# Patient Record
Sex: Female | Born: 1975 | Race: White | Hispanic: No | Marital: Married | Smoking: Never smoker
Health system: Southern US, Community
[De-identification: ages and names within clinical notes are randomized; demographics above are authoritative.]

## PROBLEM LIST (undated history)

## (undated) DIAGNOSIS — Z8619 Personal history of other infectious and parasitic diseases: Secondary | ICD-10-CM

## (undated) DIAGNOSIS — R51 Headache: Secondary | ICD-10-CM

## (undated) DIAGNOSIS — F419 Anxiety disorder, unspecified: Secondary | ICD-10-CM

## (undated) DIAGNOSIS — R519 Headache, unspecified: Secondary | ICD-10-CM

## (undated) DIAGNOSIS — T7840XA Allergy, unspecified, initial encounter: Secondary | ICD-10-CM

## (undated) HISTORY — PX: KNEE SURGERY: SHX244

## (undated) HISTORY — DX: Anxiety disorder, unspecified: F41.9

## (undated) HISTORY — DX: Personal history of other infectious and parasitic diseases: Z86.19

## (undated) HISTORY — DX: Headache: R51

## (undated) HISTORY — DX: Headache, unspecified: R51.9

## (undated) HISTORY — DX: Allergy, unspecified, initial encounter: T78.40XA

---

## 2005-01-30 ENCOUNTER — Emergency Department: Payer: Self-pay | Admitting: Emergency Medicine

## 2005-08-20 ENCOUNTER — Ambulatory Visit: Payer: Self-pay | Admitting: General Practice

## 2008-04-04 ENCOUNTER — Ambulatory Visit: Payer: Self-pay | Admitting: General Practice

## 2009-08-14 LAB — HM PAP SMEAR: HM Pap smear: POSITIVE

## 2009-11-01 ENCOUNTER — Ambulatory Visit: Payer: Self-pay

## 2011-01-12 ENCOUNTER — Ambulatory Visit: Payer: Self-pay | Admitting: General Practice

## 2014-07-26 LAB — HM PAP SMEAR: HM Pap smear: NEGATIVE

## 2015-07-23 ENCOUNTER — Encounter: Payer: Self-pay | Admitting: *Deleted

## 2015-08-02 ENCOUNTER — Encounter: Payer: Self-pay | Admitting: Obstetrics and Gynecology

## 2016-03-17 ENCOUNTER — Encounter: Payer: Self-pay | Admitting: Emergency Medicine

## 2016-03-17 ENCOUNTER — Emergency Department
Admission: EM | Admit: 2016-03-17 | Discharge: 2016-03-17 | Disposition: A | Discharge status: Home / Self Care | Payer: Commercial Managed Care - HMO | Attending: Emergency Medicine | Admitting: Emergency Medicine

## 2016-03-17 ENCOUNTER — Emergency Department: Payer: Commercial Managed Care - HMO

## 2016-03-17 DIAGNOSIS — M7918 Myalgia, other site: Secondary | ICD-10-CM

## 2016-03-17 DIAGNOSIS — M5412 Radiculopathy, cervical region: Secondary | ICD-10-CM | POA: Diagnosis not present

## 2016-03-17 DIAGNOSIS — M542 Cervicalgia: Secondary | ICD-10-CM | POA: Diagnosis present

## 2016-03-17 DIAGNOSIS — M62838 Other muscle spasm: Secondary | ICD-10-CM | POA: Diagnosis not present

## 2016-03-17 MED ORDER — DEXAMETHASONE SODIUM PHOSPHATE 10 MG/ML IJ SOLN
10.0000 mg | Freq: Once | INTRAMUSCULAR | Status: AC
  Administered 2016-03-17: 10 mg via INTRAMUSCULAR
  Filled 2016-03-17: qty 1

## 2016-03-17 MED ORDER — TRAMADOL HCL 50 MG PO TABS
50.0000 mg | ORAL_TABLET | Freq: Once | ORAL | Status: AC
  Administered 2016-03-17: 50 mg via ORAL
  Filled 2016-03-17: qty 1

## 2016-03-17 MED ORDER — ORPHENADRINE CITRATE ER 100 MG PO TB12: 100.0000 mg | ORAL_TABLET | Freq: Two times a day (BID) | ORAL | 0 refills | Status: DC

## 2016-03-17 MED ORDER — TRAMADOL HCL 50 MG PO TABS: 50.0000 mg | ORAL_TABLET | Freq: Four times a day (QID) | ORAL | 0 refills | Status: DC | PRN

## 2016-03-17 MED ORDER — METHYLPREDNISOLONE 4 MG PO TBPK: ORAL_TABLET | ORAL | 0 refills | Status: DC

## 2016-03-17 MED ORDER — ORPHENADRINE CITRATE 30 MG/ML IJ SOLN
60.0000 mg | Freq: Two times a day (BID) | INTRAMUSCULAR | Status: DC
  Administered 2016-03-17: 60 mg via INTRAMUSCULAR
  Filled 2016-03-17: qty 2

## 2016-03-17 NOTE — ED Notes (Signed)
See triage note  Having neck pain  Describes pain as spasm like with some burning   Denies recent injury

## 2016-03-17 NOTE — ED Provider Notes (Signed)
[facility] Emergency Department Provider Note  ____________________________________________   None    (approximate)  I have reviewed the triage vital signs and the nursing notes.   HISTORY  Chief Complaint Neck Pain   HPI Dawn Conrad is a 40 y.o. female who presents with neck pain x6 days that has worsened in the last 4. Patient describes pain as severe, aching, and exacerbated with certain movements. Patient has tried tylenol, goody powder, and heat at home. She also describes a burning sensation in addition to pain. Denies any numbness or tingling. Patient is able to move neck, but feels pain when doing so. Patient denies any recent trauma, but does report she painted a room in her house a few days ago. Patient reports similar neck pain over 10 years ago and having a CAT scan that showed degenerative changes. Denies fever, fatigue, chest pain, SOB, back pain, headache.    Past Medical History:  Diagnosis Date  . Anxiety   . Elevated TSH     There are no active problems to display for this patient.   History reviewed. No pertinent surgical history.  Prior to Admission medications   Medication Sig Start Date End Date Taking? Authorizing Provider  ALPRAZolam Duanne Moron) 0.5 MG tablet Take 0.5 mg by mouth at bedtime as needed for anxiety.    Historical Provider, MD  levonorgestrel (MIRENA) 20 MCG/24HR IUD 1 each by Intrauterine route once.    Historical Provider, MD  methylPREDNISolone (MEDROL DOSEPAK) 4 MG TBPK tablet Take Tapered dose as directed 03/17/16   Sable Feil, PA-C  orphenadrine (NORFLEX) 100 MG tablet Take 1 tablet (100 mg total) by mouth 2 (two) times daily. 03/17/16   Sable Feil, PA-C  traMADol (ULTRAM) 50 MG tablet Take 1 tablet (50 mg total) by mouth every 6 (six) hours as needed. 03/17/16 03/17/17  Sable Feil, PA-C    Allergies Codeine  No family history on file.  Social History Social History  Substance Use Topics  .  Smoking status: Never Smoker  . Smokeless tobacco: Never Used  . Alcohol use Yes     Comment: occas    Review of Systems Constitutional: No fever/chills ENT: No sore throat. Cardiovascular: Denies chest pain. Respiratory: Denies shortness of breath. Musculoskeletal:Positive for pain in neck and across shoulders. Negative for back pain. Skin: Negative for rash. Neurological: Negative for headaches, focal weakness or numbness.  ____________________________________________   PHYSICAL EXAM:  VITAL SIGNS: ED Triage Vitals  Enc Vitals Group     BP 03/17/16 1529 130/81     Pulse Rate 03/17/16 1529 72     Resp 03/17/16 1529 18     Temp 03/17/16 1529 98.2 F (36.8 C)     Temp Source 03/17/16 1529 Oral     SpO2 03/17/16 1529 100 %     Weight 03/17/16 1530 121 lb (54.9 kg)     Height 03/17/16 1530 5\' 2"  (1.575 m)     Head Circumference --      Peak Flow --      Pain Score 03/17/16 1530 10     Pain Loc --      Pain Edu? --      Excl. in [place]? --     Constitutional: Alert and oriented. Well appearing and in no acute distress. Eyes: Conjunctivae are normal. PERRL. Head: Atraumatic. Nose: No rhinnorhea. Mouth/Throat: Mucous membranes are moist.  Oropharynx non-erythematous. Neck: No stridor.  No cervical spine tenderness to palpation. Supple,  mild restriction in extension due to pain. No thyromegaly or goiter.  Hematological/Lymphatic/Immunilogical: No cervical lymphadenopathy.  Cardiovascular: Normal rate, regular rhythm. Grossly normal heart sounds.  Good peripheral circulation. Respiratory: Normal respiratory effort.  No retractions. Lungs CTAB. Musculoskeletal: Full ROM in bilateral upper extremities without pain or difficulty.  No bony tenderness along thoracic or lumbar spine.  Neurologic:  Normal speech and language. No gross focal neurologic deficits are appreciated.  Skin:  Skin is warm, dry and intact. No rash noted. Psychiatric: Mood and affect are normal. Speech and  behavior are normal.  ____________________________________________   LABS (all labs ordered are listed, but only abnormal results are displayed)  Labs Reviewed - No data to display ____________________________________________  EKG  None. ____________________________________________  RADIOLOGY   No findings on x-ray of the cervical spine. ____________________________________________   PROCEDURES  Procedure(s) performed: None  Procedures  Critical Care performed: No  ____________________________________________   INITIAL IMPRESSION / ASSESSMENT AND PLAN / ED COURSE  Pertinent labs & imaging results that were available during my care of the patient were reviewed by me and considered in my medical decision making (see chart for details).  Cervical neuropathy and spasms.   Discuss negative x-ray finding with patient. Patient given discharge care instructions. Given a prescription for Norflex and Medrol Dospak. Advised follow-up with family doctor if condition persists more than 3-5 days.  Clinical Course     ____________________________________________   FINAL CLINICAL IMPRESSION(S) / ED DIAGNOSES  Final diagnoses:  Muscle spasms of neck  Musculoskeletal pain      NEW MEDICATIONS STARTED DURING THIS VISIT:  Discharge Medication List as of 03/17/2016  5:01 PM    START taking these medications   Details  methylPREDNISolone (MEDROL DOSEPAK) 4 MG TBPK tablet Take Tapered dose as directed, Print    orphenadrine (NORFLEX) 100 MG tablet Take 1 tablet (100 mg total) by mouth 2 (two) times daily., Starting Mon 03/17/2016, Print    traMADol (ULTRAM) 50 MG tablet Take 1 tablet (50 mg total) by mouth every 6 (six) hours as needed., Starting Mon 03/17/2016, Until Tue 03/17/2017, Print         Note:  This document was prepared using Dragon voice recognition software and may include unintentional dictation errors.   Sable Feil, PA-C 03/17/16 [place], PA-C 03/17/16 [place], PA-C 03/17/16 [place], PA-C 03/19/16 0008    Carrie Mew, MD 03/20/16 [id number]

## 2016-03-17 NOTE — ED Triage Notes (Signed)
Pt presents to ED with reports of neck pain, spasms and burning pain. Pt reports history of degenerative disc disease. Pt denies injury to neck. Pt denies fever. Pt in no apparent distress.

## 2016-04-09 ENCOUNTER — Ambulatory Visit: Payer: Self-pay | Admitting: Family Medicine

## 2016-04-09 DIAGNOSIS — Z0289 Encounter for other administrative examinations: Secondary | ICD-10-CM

## 2016-04-24 ENCOUNTER — Other Ambulatory Visit: Payer: Self-pay | Admitting: *Deleted

## 2016-10-28 ENCOUNTER — Encounter: Payer: Self-pay | Admitting: Internal Medicine

## 2016-10-28 ENCOUNTER — Ambulatory Visit (INDEPENDENT_AMBULATORY_CARE_PROVIDER_SITE_OTHER): Payer: 59 | Admitting: Internal Medicine

## 2016-10-28 VITALS — BP 120/80 | HR 65 | Temp 97.9°F | Ht 62.0 in | Wt 127.0 lb

## 2016-10-28 DIAGNOSIS — G8929 Other chronic pain: Secondary | ICD-10-CM

## 2016-10-28 DIAGNOSIS — M542 Cervicalgia: Secondary | ICD-10-CM

## 2016-10-28 DIAGNOSIS — F419 Anxiety disorder, unspecified: Secondary | ICD-10-CM | POA: Diagnosis not present

## 2016-10-28 DIAGNOSIS — J301 Allergic rhinitis due to pollen: Secondary | ICD-10-CM | POA: Diagnosis not present

## 2016-10-28 MED ORDER — TRAMADOL HCL 50 MG PO TABS: 50.0000 mg | ORAL_TABLET | Freq: Two times a day (BID) | ORAL | 0 refills | Status: DC | PRN

## 2016-10-28 MED ORDER — ALPRAZOLAM 0.5 MG PO TABS: 0.5000 mg | ORAL_TABLET | Freq: Every evening | ORAL | 0 refills | Status: DC | PRN

## 2016-10-28 MED ORDER — ORPHENADRINE CITRATE ER 100 MG PO TB12: 100.0000 mg | ORAL_TABLET | Freq: Every day | ORAL | 0 refills | Status: DC | PRN
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
## 2016-10-28 NOTE — Assessment & Plan Note (Signed)
Continue Allegra prn

## 2016-10-28 NOTE — Assessment & Plan Note (Signed)
Controlled on Xanax prn Refilled today

## 2016-10-28 NOTE — Patient Instructions (Signed)
Allergic Rhinitis Allergic rhinitis is when the mucous membranes in the nose respond to allergens. Allergens are particles in the air that cause your body to have an allergic reaction. This causes you to release allergic antibodies. Through a chain of events, these eventually cause you to release histamine into the blood stream. Although meant to protect the body, it is this release of histamine that causes your discomfort, such as frequent sneezing, congestion, and an itchy, runny nose. What are the causes? Seasonal allergic rhinitis (hay fever) is caused by pollen allergens that may come from grasses, trees, and weeds. Year-round allergic rhinitis (perennial allergic rhinitis) is caused by allergens such as house dust mites, pet dander, and mold spores. What are the signs or symptoms?  Nasal stuffiness (congestion).  Itchy, runny nose with sneezing and tearing of the eyes. How is this diagnosed? Your health care provider can help you determine the allergen or allergens that trigger your symptoms. If you and your health care provider are unable to determine the allergen, skin or blood testing may be used. Your health care provider will diagnose your condition after taking your health history and performing a physical exam. Your health care provider may assess you for other related conditions, such as asthma, pink eye, or an ear infection. How is this treated? Allergic rhinitis does not have a cure, but it can be controlled by:  Medicines that block allergy symptoms. These may include allergy shots, nasal sprays, and oral antihistamines.  Avoiding the allergen. Hay fever may often be treated with antihistamines in pill or nasal spray forms. Antihistamines block the effects of histamine. There are over-the-counter medicines that may help with nasal congestion and swelling around the eyes. Check with your health care provider before taking or giving this medicine. If avoiding the allergen or the  medicine prescribed do not work, there are many new medicines your health care provider can prescribe. Stronger medicine may be used if initial measures are ineffective. Desensitizing injections can be used if medicine and avoidance does not work. Desensitization is when a patient is given ongoing shots until the body becomes less sensitive to the allergen. Make sure you follow up with your health care provider if problems continue. Follow these instructions at home: It is not possible to completely avoid allergens, but you can reduce your symptoms by taking steps to limit your exposure to them. It helps to know exactly what you are allergic to so that you can avoid your specific triggers. Contact a health care provider if:  You have a fever.  You develop a cough that does not stop easily (persistent).  You have shortness of breath.  You start wheezing.  Symptoms interfere with normal daily activities. This information is not intended to replace advice given to you by your health care provider. Make sure you discuss any questions you have with your health care provider. Document Released: 02/18/2001 Document Revised: 01/25/2016 Document Reviewed: 01/31/2013 Elsevier Interactive Patient Education  2017 Elsevier Inc.  

## 2016-10-28 NOTE — Assessment & Plan Note (Signed)
Controlled on Norflex and Tramadol prn Refilled today

## 2016-11-19 ENCOUNTER — Other Ambulatory Visit: Payer: Self-pay | Admitting: Internal Medicine

## 2016-11-19 NOTE — Telephone Encounter (Signed)
Last filled 10/28/16... Please advise 

## 2016-12-23 ENCOUNTER — Encounter: Payer: 59 | Admitting: Internal Medicine

## 2017-02-11 ENCOUNTER — Ambulatory Visit (INDEPENDENT_AMBULATORY_CARE_PROVIDER_SITE_OTHER): Payer: 59 | Admitting: Internal Medicine

## 2017-02-11 ENCOUNTER — Encounter: Payer: Self-pay | Admitting: Internal Medicine

## 2017-02-11 VITALS — BP 120/78 | HR 73 | Temp 98.0°F | Ht 62.0 in | Wt 132.0 lb

## 2017-02-11 DIAGNOSIS — Z23 Encounter for immunization: Secondary | ICD-10-CM

## 2017-02-11 DIAGNOSIS — Z114 Encounter for screening for human immunodeficiency virus [HIV]: Secondary | ICD-10-CM | POA: Diagnosis not present

## 2017-02-11 DIAGNOSIS — G8929 Other chronic pain: Secondary | ICD-10-CM

## 2017-02-11 DIAGNOSIS — Z0000 Encounter for general adult medical examination without abnormal findings: Secondary | ICD-10-CM

## 2017-02-11 DIAGNOSIS — H6123 Impacted cerumen, bilateral: Secondary | ICD-10-CM | POA: Diagnosis not present

## 2017-02-11 DIAGNOSIS — M542 Cervicalgia: Secondary | ICD-10-CM | POA: Diagnosis not present

## 2017-02-11 NOTE — Addendum Note (Signed)
Addended by: Lurlean Nanny on: 02/11/2017 03:24 PM   Modules accepted: Orders

## 2017-02-11 NOTE — Progress Notes (Signed)
Subjective:    Patient ID: Dawn Conrad, female    DOB: 1976/03/11, 41 y.o.   MRN: [id number]  HPI  Pt presents to the clinic today for her annual exam. She is very concerned about her chronic neck pain. She feels like her pain has been worse lately. She had a normal xray 03/2016. She had a normal MRI in 2007. She describes the pain as stiff, sharp and stabbing. She has difficulty turning her head. She reports daily headaches. She is taking Tramadol and Norflex without any relief. She used to get relief from adjustments by her chiropractor but she reports that isn't even helping at this point. She would like to know what her options are.  Flu: 03/2016 Tetanus: unsure Pap Smear: 08/2016, [facility] Mammogram: 2015, [facility]: annually Dentist: annually  Diet: She does eat meat. She consumes fruits and veggies daily. She occasionally eat fried foods. She drinks mostly water. Exercise: None  Review of Systems      Past Medical History:  Diagnosis Date  . Allergy   . Anxiety   . Frequent headaches   . History of chicken pox     Current Outpatient Prescriptions  Medication Sig Dispense Refill  . ALPRAZolam (XANAX) 0.5 MG tablet Take 1 tablet (0.5 mg total) by mouth at bedtime as needed for anxiety. 20 tablet 0  . levonorgestrel (MIRENA) 20 MCG/24HR IUD 1 each by Intrauterine route once.    . orphenadrine (NORFLEX) 100 MG tablet TAKE 1 TABLET BY MOUTH EVERY DAY AS NEEDED FOR MUSCLE SPASMS 20 tablet 0  . traMADol (ULTRAM) 50 MG tablet Take 1 tablet (50 mg total) by mouth every 12 (twelve) hours as needed. 20 tablet 0   No current facility-administered medications for this visit.     Allergies  Allergen Reactions  . Codeine Other (See Comments)    "shakiness" per pt    Family History  Problem Relation Age of Onset  . Arthritis Mother   . Arthritis Father   . Heart disease Father   . Arthritis Maternal Grandmother   . Arthritis Paternal  Grandmother     Social History   Social History  . Marital status: Married    Spouse name: N/A  . Number of children: N/A  . Years of education: N/A   Occupational History  . Not on file.   Social History Main Topics  . Smoking status: Never Smoker  . Smokeless tobacco: Never Used  . Alcohol use Yes     Comment: occasional  . Drug use: No  . Sexual activity: Yes     Comment: mirena   Other Topics Concern  . Not on file   Social History Narrative  . No narrative on file     Constitutional: Pt reports frequent headaches. Denies fever, malaise, fatigue, headache or abrupt weight changes.  HEENT: Denies eye pain, eye redness, ear pain, ringing in the ears, wax buildup, runny nose, nasal congestion, bloody nose, or sore throat. Respiratory: Denies difficulty breathing, shortness of breath, cough or sputum production.   Cardiovascular: Denies chest pain, chest tightness, palpitations or swelling in the hands or feet.  Gastrointestinal: Denies abdominal pain, bloating, constipation, diarrhea or blood in the stool.  GU: Denies urgency, frequency, pain with urination, burning sensation, blood in urine, odor or discharge. Musculoskeletal: Pt reports neck pain. Denies difficulty with gait, muscle pain or joint swelling.  Skin: Denies redness, rashes, lesions or ulcercations.  Neurological: Denies dizziness, difficulty with memory, difficulty with  speech or problems with balance and coordination.  Psych: Pt has a history of anxiety. Denies  depression, SI/HI.  No other specific complaints in a complete review of systems (except as listed in HPI above).  Objective:   Physical Exam  BP 120/78   Pulse 73   Temp 98 F (36.7 C) (Oral)   Ht 5\' 2"  (1.575 m)   Wt 132 lb (59.9 kg)   SpO2 99%   BMI 24.14 kg/m  Wt Readings from Last 3 Encounters:  02/11/17 132 lb (59.9 kg)  10/28/16 127 lb (57.6 kg)  03/17/16 121 lb (54.9 kg)    General: Appears her stated age, well developed,  well nourished in NAD. Skin: Warm, dry and intact. HEENT: Head: normal shape and size; Eyes: sclera white, no icterus, conjunctiva pink, PERRLA and EOMs intact; Ears: cerumen impaction; Throat/Mouth: Teeth present, mucosa pink and moist, no exudate, lesions or ulcerations noted.  Neck:  Neck supple, trachea midline. No masses, lumps or thyromegaly present.  Cardiovascular: Normal rate and rhythm. S1,S2 noted.  No murmur, rubs or gallops noted. No JVD or BLE edema.  Pulmonary/Chest: Normal effort and positive vesicular breath sounds. No respiratory distress. No wheezes, rales or ronchi noted.  Abdomen: Soft and nontender. Normal bowel sounds. No distention or masses noted. Liver, spleen and kidneys non palpable.  MSK: Decreased flexion of the cervical spine. Normal extension, rotation and lateral bending. Pain with palpation along the cervical spine and paralumbar muscles. Strength 5/5 BEU/BLE. No difficulty with gait.  Neurological: Alert and oriented. Cranial nerves II-XII grossly intact. Coordination normal.  Psychiatric: Mood and affect normal. Behavior is normal. Judgment and thought content normal.         Assessment & Plan:   Preventative Health Maintenance:  Encouraged her to get a flu shot in the fall Tdap today Pap smear UTD Mammogram ordered- she will call [facility] to schedule, number provided Encouraged her to consume a balanced diet and exercise regimen Advised her to see an eye doctor and dentist annually Will check CBC, CMET, Lipid and HIV today  Bilateral Cerumen Impaction:  Manual lavage by CMA  Advised her to use Debrox 2 x week to prevent wax buildup  Chronic Neck Pain:  Referral placed to Physical Med to discuss injections

## 2017-02-11 NOTE — Addendum Note (Signed)
Addended by: Lurlean Nanny on: 02/11/2017 06:47 PM   Modules accepted: Orders

## 2017-02-11 NOTE — Patient Instructions (Signed)

## 2017-02-12 ENCOUNTER — Other Ambulatory Visit: Payer: Self-pay | Admitting: Internal Medicine

## 2017-02-12 MED ORDER — CYCLOBENZAPRINE HCL 5 MG PO TABS: 5.0000 mg | ORAL_TABLET | Freq: Every day | ORAL | 0 refills | Status: DC | PRN

## 2017-02-12 NOTE — Telephone Encounter (Signed)
Pt left v/m; pt seen annual on 02/11/17; pt cannot get appt with pain mgt until 05/04/17. Pt request refill xanax(last refilled # 20 on 10/28/16) and tramadol(last refilled # 20 on 10/28/16.) Pt also said norflex is not effective and pt had previously taken flexeril. Pt wants to know if Avie Echevaria NP would prescribe flexeril. Pt request cb. [facility] [address].

## 2017-02-12 NOTE — Telephone Encounter (Signed)
Rx called into pharmacy I spoke to pt and she is aware that she needs to come in for CSA

## 2017-02-12 NOTE — Telephone Encounter (Signed)
Next 1-2 weeks

## 2017-02-12 NOTE — Telephone Encounter (Signed)
Ok to phone in Xanax and Tramadol. Will send in Flexeril. Make sure she has a CSA and UDS on file

## 2017-02-13 LAB — CBC
HEMATOCRIT: 40.5 % (ref 34.0–46.6)
Hemoglobin: 13.9 g/dL (ref 11.1–15.9)
MCH: 29 pg (ref 26.6–33.0)
MCHC: 34.3 g/dL (ref 31.5–35.7)
MCV: 84 fL (ref 79–97)
PLATELETS: 246 10*3/uL (ref 150–379)
RBC: 4.8 x10E6/uL (ref 3.77–5.28)
RDW: 14.2 % (ref 12.3–15.4)
WBC: 8.2 10*3/uL (ref 3.4–10.8)

## 2017-02-13 LAB — COMPREHENSIVE METABOLIC PANEL
A/G RATIO: 2 (ref 1.2–2.2)
ALK PHOS: 49 IU/L (ref 39–117)
ALT: 12 IU/L (ref 0–32)
AST: 17 IU/L (ref 0–40)
Albumin: 4.7 g/dL (ref 3.5–5.5)
BILIRUBIN TOTAL: 0.3 mg/dL (ref 0.0–1.2)
BUN/Creatinine Ratio: 16 (ref 9–23)
BUN: 11 mg/dL (ref 6–24)
CHLORIDE: 101 mmol/L (ref 96–106)
CO2: 23 mmol/L (ref 20–29)
Calcium: 9.4 mg/dL (ref 8.7–10.2)
Creatinine, Ser: 0.68 mg/dL (ref 0.57–1.00)
GFR calc Af Amer: 125 mL/min/{1.73_m2} (ref >59)
GFR calc non Af Amer: 108 mL/min/{1.73_m2} (ref >59)
GLOBULIN, TOTAL: 2.4 g/dL (ref 1.5–4.5)
Glucose: 83 mg/dL (ref 65–99)
POTASSIUM: 4.3 mmol/L (ref 3.5–5.2)
SODIUM: 140 mmol/L (ref 134–144)
Total Protein: 7.1 g/dL (ref 6.0–8.5)

## 2017-02-13 LAB — LIPID PANEL
CHOL/HDL RATIO: 2.5 ratio (ref 0.0–4.4)
Cholesterol, Total: 156 mg/dL (ref 100–199)
HDL: 63 mg/dL (ref >39)
LDL Calculated: 79 mg/dL (ref 0–99)
TRIGLYCERIDES: 69 mg/dL (ref 0–149)
VLDL Cholesterol Cal: 14 mg/dL (ref 5–40)

## 2017-02-13 LAB — HIV ANTIBODY (ROUTINE TESTING W REFLEX): HIV SCREEN 4TH GENERATION: NONREACTIVE

## 2017-02-16 ENCOUNTER — Telehealth: Payer: Self-pay | Admitting: Internal Medicine

## 2017-02-16 NOTE — Telephone Encounter (Signed)
PT called to ask if CSA form can be faxed. She is having trouble getting here to sign. Fax [phone]

## 2017-02-18 NOTE — Telephone Encounter (Signed)
Let Dawn Conrad know via Skype to let pt know contract hast to be signed in person as there needs to be a witness signature

## 2017-03-01 ENCOUNTER — Other Ambulatory Visit: Payer: Self-pay | Admitting: Internal Medicine

## 2017-03-02 NOTE — Telephone Encounter (Signed)
Last filled 02/12/17... Please advise

## 2017-03-19 ENCOUNTER — Other Ambulatory Visit: Payer: Self-pay | Admitting: Internal Medicine

## 2017-03-19 NOTE — Telephone Encounter (Signed)
Last office visit 02/11/2017.  Last refilled 03/03/2017 for #20 with no refills.  Ok to refill?

## 2017-03-20 NOTE — Telephone Encounter (Signed)
Not due for refill

## 2017-03-30 ENCOUNTER — Other Ambulatory Visit: Payer: Self-pay | Admitting: Physical Medicine and Rehabilitation

## 2017-03-30 DIAGNOSIS — M542 Cervicalgia: Secondary | ICD-10-CM

## 2017-04-03 ENCOUNTER — Other Ambulatory Visit: Payer: Self-pay | Admitting: Internal Medicine

## 2017-04-03 NOTE — Telephone Encounter (Signed)
Last refill 03/03/17  Last OV 02/11/17  Ok to refill?

## 2017-04-06 ENCOUNTER — Other Ambulatory Visit: Payer: 59

## 2017-04-13 ENCOUNTER — Other Ambulatory Visit: Payer: Self-pay | Admitting: Internal Medicine

## 2017-04-13 NOTE — Telephone Encounter (Signed)
Last filled 02/12/17... Please advise

## 2017-04-14 NOTE — Telephone Encounter (Signed)
Rx called in to pharmacy. 

## 2017-04-14 NOTE — Telephone Encounter (Signed)
Ok to phone in Xanax 

## 2017-04-15 ENCOUNTER — Other Ambulatory Visit: Payer: Self-pay | Admitting: Internal Medicine

## 2017-04-16 ENCOUNTER — Telehealth: Payer: Self-pay | Admitting: Internal Medicine

## 2017-04-17 ENCOUNTER — Telehealth: Payer: Self-pay | Admitting: Internal Medicine

## 2017-04-17 NOTE — Telephone Encounter (Signed)
This Rx has already been called in on 11/6/18as noted in refill request encounter 04/13/17... Pt is aware

## 2017-04-17 NOTE — Telephone Encounter (Signed)
Pt requesting refill on Xanax .5 tablets. Last office visit was 02/11/17.

## 2017-04-17 NOTE — Telephone Encounter (Signed)
Copied from [place] [id number]. Topic: Inquiry >> Apr 16, 2017 12:54 PM Cecelia Byars, NT wrote: Reason for CRM: want a refill on alprazolam 5 mg has says she has called the pharmacy and they are waiting for prescription to be sent over, [facility] and she has called the pharmacy 3 times since last week    This pt is seen at [facility] and message was sent to [facility] clinical pool. Will route as a telephone encounter.

## 2017-04-17 NOTE — Telephone Encounter (Signed)
This was approved 11/6. Has it been phoned in?

## 2017-05-08 ENCOUNTER — Ambulatory Visit
Admission: RE | Admit: 2017-05-08 | Discharge: 2017-05-08 | Disposition: A | Discharge status: Home / Self Care | Payer: 59 | Source: Ambulatory Visit | Attending: Physical Medicine and Rehabilitation | Admitting: Physical Medicine and Rehabilitation

## 2017-05-08 DIAGNOSIS — M542 Cervicalgia: Secondary | ICD-10-CM

## 2017-07-14 ENCOUNTER — Other Ambulatory Visit: Payer: Self-pay | Admitting: Internal Medicine

## 2017-07-14 MED ORDER — ALPRAZOLAM 0.5 MG PO TABS: ORAL_TABLET | ORAL | 0 refills | Status: DC

## 2017-07-14 NOTE — Telephone Encounter (Signed)
Last Rx 04/14/2017 #20. Last OV 02/2017-CPE

## 2017-07-14 NOTE — Telephone Encounter (Signed)
Copied from [place] [id number]. Topic: Quick Communication - Rx Refill/Question >> Jul 14, 2017 11:38 AM Synthia Innocent wrote: Medication:  ALPRAZolam Duanne Moron) 0.5 MG tablet  and  traMADol (ULTRAM) 50 MG tablet    Has the patient contacted their pharmacy? No, was told by CMA to call office   (Agent: If no, request that the patient contact the pharmacy for the refill.)   Preferred Pharmacy (with phone number or street name[facility] [address] Please be advised that RX refills may take up to 3 business days. We ask that you follow-up with your pharmacy.

## 2017-10-19 ENCOUNTER — Other Ambulatory Visit: Payer: Self-pay | Admitting: Internal Medicine

## 2017-10-19 NOTE — Telephone Encounter (Signed)
Xanax last filled 07/2017 and Tramadol last filled 02/2017... Please advise

## 2017-12-03 ENCOUNTER — Other Ambulatory Visit: Payer: Self-pay | Admitting: Internal Medicine

## 2017-12-03 NOTE — Telephone Encounter (Signed)
[facility] [address] Last filled 10/19/2017-- both Xanax #20 and Tramadol #20 Last OV CPE 02/2017 No upcoming OV scheduled  No UDS/CSA on file

## 2017-12-03 NOTE — Telephone Encounter (Signed)
Will refill Tramadol. How often is she taking Xanax? If she is taking 2-3 times per week, we need to discuss alternative meds.

## 2017-12-04 MED ORDER — ALPRAZOLAM 0.5 MG PO TABS: 0.5000 mg | ORAL_TABLET | Freq: Every day | ORAL | 0 refills | Status: DC | PRN

## 2017-12-04 NOTE — Telephone Encounter (Signed)
Pt states this past month has been more stressful than usual as her son is getting married and has had increased anxiety  And he is getting married next week. Pt is aware that you recommend daily Rx if pt is not taking sparingly. Pt wants to know if you can understand that this month and leading up to next week is why she has been taking more... Please advise

## 2017-12-04 NOTE — Telephone Encounter (Signed)
Xanax refilled.  

## 2017-12-04 NOTE — Addendum Note (Signed)
Addended by: Jearld Fenton on: 12/04/2017 02:05 PM   Modules accepted: Orders

## 2018-01-20 ENCOUNTER — Other Ambulatory Visit: Payer: Self-pay | Admitting: Internal Medicine

## 2018-01-20 NOTE — Telephone Encounter (Signed)
Last filled 12/04/2017 #20, no CSA/UDS on file... Last OV CPE 02/2017, no upcoming OV... Please advise

## 2018-02-12 ENCOUNTER — Other Ambulatory Visit: Payer: Self-pay | Admitting: Internal Medicine

## 2018-02-12 NOTE — Telephone Encounter (Signed)
Last filled 12/05/2017... Please advise

## 2018-02-14 NOTE — Telephone Encounter (Signed)
Pt hasn't been seen in a year, needs appt. Denied.

## 2018-03-01 NOTE — Telephone Encounter (Signed)
° ° °  Pt scheduled a Cpe for November and is asking if she need to come in before then to have her tramadol refill

## 2018-04-14 ENCOUNTER — Other Ambulatory Visit: Payer: Self-pay | Admitting: Internal Medicine

## 2018-04-14 NOTE — Telephone Encounter (Signed)
Last filled 01/20/18... Pt had an upcoming OV for CPE 04/26/18 as you are out of the office, but is having to be rescheduled... Please advise

## 2018-04-26 ENCOUNTER — Encounter: Payer: 59 | Admitting: Internal Medicine

## 2018-04-29 ENCOUNTER — Encounter: Payer: Self-pay | Admitting: Internal Medicine

## 2018-06-29 ENCOUNTER — Ambulatory Visit (INDEPENDENT_AMBULATORY_CARE_PROVIDER_SITE_OTHER): Payer: 59 | Admitting: Internal Medicine

## 2018-06-29 ENCOUNTER — Encounter: Payer: Self-pay | Admitting: Internal Medicine

## 2018-06-29 VITALS — BP 118/76 | HR 73 | Temp 97.9°F | Ht 62.75 in | Wt 145.0 lb

## 2018-06-29 DIAGNOSIS — R635 Abnormal weight gain: Secondary | ICD-10-CM

## 2018-06-29 DIAGNOSIS — M542 Cervicalgia: Secondary | ICD-10-CM | POA: Diagnosis not present

## 2018-06-29 DIAGNOSIS — F419 Anxiety disorder, unspecified: Secondary | ICD-10-CM | POA: Diagnosis not present

## 2018-06-29 DIAGNOSIS — Z0000 Encounter for general adult medical examination without abnormal findings: Secondary | ICD-10-CM

## 2018-06-29 DIAGNOSIS — R61 Generalized hyperhidrosis: Secondary | ICD-10-CM | POA: Diagnosis not present

## 2018-06-29 DIAGNOSIS — G8929 Other chronic pain: Secondary | ICD-10-CM

## 2018-06-29 DIAGNOSIS — Z23 Encounter for immunization: Secondary | ICD-10-CM | POA: Diagnosis not present

## 2018-06-29 DIAGNOSIS — Z79899 Other long term (current) drug therapy: Secondary | ICD-10-CM | POA: Diagnosis not present

## 2018-06-29 DIAGNOSIS — N926 Irregular menstruation, unspecified: Secondary | ICD-10-CM

## 2018-06-29 LAB — COMPREHENSIVE METABOLIC PANEL
ALBUMIN: 4.1 g/dL (ref 3.5–5.2)
ALT: 12 U/L (ref 0–35)
AST: 16 U/L (ref 0–37)
Alkaline Phosphatase: 34 U/L — ABNORMAL LOW (ref 39–117)
BUN: 18 mg/dL (ref 6–23)
CO2: 26 meq/L (ref 19–32)
CREATININE: 0.69 mg/dL (ref 0.40–1.20)
Calcium: 9 mg/dL (ref 8.4–10.5)
Chloride: 105 mEq/L (ref 96–112)
GFR: 92.39 mL/min (ref >60.00)
GLUCOSE: 90 mg/dL (ref 70–99)
Potassium: 4.5 mEq/L (ref 3.5–5.1)
Sodium: 137 mEq/L (ref 135–145)
Total Bilirubin: 0.2 mg/dL (ref 0.2–1.2)
Total Protein: 6.8 g/dL (ref 6.0–8.3)

## 2018-06-29 LAB — TSH: TSH: 4.4 u[IU]/mL (ref 0.35–4.50)

## 2018-06-29 LAB — CBC
HCT: 36.9 % (ref 36.0–46.0)
HEMOGLOBIN: 11.9 g/dL — AB (ref 12.0–15.0)
MCHC: 32.2 g/dL (ref 30.0–36.0)
MCV: 72.5 fl — AB (ref 78.0–100.0)
PLATELETS: 318 10*3/uL (ref 150.0–400.0)
RBC: 5.09 Mil/uL (ref 3.87–5.11)
RDW: 22 % — ABNORMAL HIGH (ref 11.5–15.5)
WBC: 6.5 10*3/uL (ref 4.0–10.5)

## 2018-06-29 LAB — LIPID PANEL
CHOL/HDL RATIO: 3
Cholesterol: 161 mg/dL (ref 0–200)
HDL: 60.8 mg/dL (ref >39.00)
LDL CALC: 92 mg/dL (ref 0–99)
NONHDL: 100.31
Triglycerides: 41 mg/dL (ref 0.0–149.0)
VLDL: 8.2 mg/dL (ref 0.0–40.0)

## 2018-06-29 LAB — VITAMIN D 25 HYDROXY (VIT D DEFICIENCY, FRACTURES): VITD: 27.84 ng/mL — AB (ref 30.00–100.00)

## 2018-06-29 MED ORDER — ALPRAZOLAM 0.5 MG PO TABS: ORAL_TABLET | ORAL | 0 refills | Status: DC

## 2018-06-29 MED ORDER — CYCLOBENZAPRINE HCL 5 MG PO TABS: 5.0000 mg | ORAL_TABLET | Freq: Every day | ORAL | 0 refills | Status: DC | PRN

## 2018-06-29 MED ORDER — TRAMADOL HCL 50 MG PO TABS: ORAL_TABLET | ORAL | 0 refills | Status: DC

## 2018-06-29 NOTE — Assessment & Plan Note (Signed)
Encouraged regular stretching, heat and massage RX for Tramadol and Flexeril refilled today

## 2018-06-29 NOTE — Patient Instructions (Signed)

## 2018-06-29 NOTE — Progress Notes (Signed)
Subjective:    Patient ID: Dawn Conrad, female    DOB: 02/21/1976, 43 y.o.   MRN: [id number]  HPI  Pt presents to the clinic today for her annual exam. She is also due to follow up chronic conditions.  Anxiety: Triggered by general life stress. She takes Xanax 1-2 times per week. UDS and CSA reviewed. She is due to update CSA and UDS today.   Chronic Neck Pain: Causing daily headaches. Xray from 03/2016 reviewed. MRI from 2007 reviewed. She gets regular adjustments by her chiropractor. She takes Tramadol and Flexeril as needed with good relief. She would like a refill of these medications today.  Flu: 03/2016 Tetanus: 02/2017 Pap Smear: 08/2016 Mammogram: 2015 Vision Screening: annually Dentist: annually  Diet: She does eat meat. She consumes more veggies than fruits. She does not eat fried foods. She drinks mostly coffee, water, soda and wine. Exercise: None  Review of Systems      Past Medical History:  Diagnosis Date  . Allergy   . Anxiety   . Frequent headaches   . History of chicken pox     Current Outpatient Medications  Medication Sig Dispense Refill  . ALPRAZolam (XANAX) 0.5 MG tablet TAKE 1 TABLET BY MOUTH EVERY DAY AS NEEDED FOR ANXIETY 20 tablet 0  . cyclobenzaprine (FLEXERIL) 5 MG tablet TAKE 1 TABLET (5 MG TOTAL) BY MOUTH DAILY AS NEEDED FOR MUSCLE SPASMS. 20 tablet 0  . levonorgestrel (MIRENA) 20 MCG/24HR IUD 1 each by Intrauterine route once.    . traMADol (ULTRAM) 50 MG tablet TAKE 1 TABLET BY MOUTH EVERY 12 HOURS IF NEEDED 20 tablet 0   No current facility-administered medications for this visit.     Allergies  Allergen Reactions  . Codeine Other (See Comments)    "shakiness" per pt    Family History  Problem Relation Age of Onset  . Arthritis Mother   . Arthritis Father   . Heart disease Father   . Arthritis Maternal Grandmother   . Arthritis Paternal Grandmother     Social History   Socioeconomic History  . Marital status: Married     Spouse name: Not on file  . Number of children: Not on file  . Years of education: Not on file  . Highest education level: Not on file  Occupational History  . Not on file  Social Needs  . Financial resource strain: Not on file  . Food insecurity:    Worry: Not on file    Inability: Not on file  . Transportation needs:    Medical: Not on file    Non-medical: Not on file  Tobacco Use  . Smoking status: Never Smoker  . Smokeless tobacco: Never Used  Substance and Sexual Activity  . Alcohol use: Yes    Comment: occasional  . Drug use: No  . Sexual activity: Yes    Comment: mirena  Lifestyle  . Physical activity:    Days per week: Not on file    Minutes per session: Not on file  . Stress: Not on file  Relationships  . Social connections:    Talks on phone: Not on file    Gets together: Not on file    Attends religious service: Not on file    Active member of club or organization: Not on file    Attends meetings of clubs or organizations: Not on file    Relationship status: Not on file  . Intimate partner violence:    Fear  of current or ex partner: Not on file    Emotionally abused: Not on file    Physically abused: Not on file    Forced sexual activity: Not on file  Other Topics Concern  . Not on file  Social History Narrative  . Not on file     Constitutional: Pt reports intermittent headaches. Denies fever, malaise, fatigue, or abrupt weight changes.  HEENT: Denies eye pain, eye redness, ear pain, ringing in the ears, wax buildup, runny nose, nasal congestion, bloody nose, or sore throat. Respiratory: Denies difficulty breathing, shortness of breath, cough or sputum production.   Cardiovascular: Denies chest pain, chest tightness, palpitations or swelling in the hands or feet.  Gastrointestinal: Denies abdominal pain, bloating, constipation, diarrhea or blood in the stool.  GU: Denies urgency, frequency, pain with urination, burning sensation, blood in urine,  odor or discharge. Musculoskeletal: Pt reports intermittent right knee pain, chronic neck pain. Denies decrease in range of motion, difficulty with gait, muscle pain or joint swelling.  Skin: Denies redness, rashes, lesions or ulcercations.  Neurological: Denies dizziness, difficulty with memory, difficulty with speech or problems with balance and coordination.  Psych: Pt has a history of anxiety. Denies depression, SI/HI.  No other specific complaints in a complete review of systems (except as listed in HPI above).  Objective:   Physical Exam  BP 118/76   Pulse 73   Temp 97.9 F (36.6 C) (Oral)   Ht 5' 2.75" (1.594 m)   Wt 145 lb (65.8 kg)   SpO2 98%   BMI 25.89 kg/m  Wt Readings from Last 3 Encounters:  06/29/18 145 lb (65.8 kg)  02/11/17 132 lb (59.9 kg)  10/28/16 127 lb (57.6 kg)    General: Appears her stated age, well developed, well nourished in NAD. Skin: Warm, dry and intact.  HEENT: Head: normal shape and size; Eyes: sclera white, no icterus, conjunctiva pink, PERRLA and EOMs intact; Ears: Tm's gray and intact, normal light reflex; Throat/Mouth: Teeth present, mucosa pink and moist, no exudate, lesions or ulcerations noted.  Neck:  Neck supple, trachea midline. No masses, lumps or thyromegaly present.  Cardiovascular: Normal rate and rhythm. S1,S2 noted.  No murmur, rubs or gallops noted. No JVD or BLE edema. No carotid bruits noted. Pulmonary/Chest: Normal effort and positive vesicular breath sounds. No respiratory distress. No wheezes, rales or ronchi noted.  Abdomen: Soft and nontender. Normal bowel sounds. No distention or masses noted. Liver, spleen and kidneys non palpable. Musculoskeletal: Normal flexion, extension and rotation of the cervical spine. Bony tenderness noted over the cervical spine. Strength 5/5 BUE/BLE. No difficulty with gait.  Neurological: Alert and oriented. Cranial nerves II-XII grossly intact. Coordination normal.  Psychiatric: Mood and affect  normal. Behavior is normal. Judgment and thought content normal.    BMET    Component Value Date/Time   NA 140 02/11/2017 1538   K 4.3 02/11/2017 1538   CL 101 02/11/2017 1538   CO2 23 02/11/2017 1538   GLUCOSE 83 02/11/2017 1538   BUN 11 02/11/2017 1538   CREATININE 0.68 02/11/2017 1538   CALCIUM 9.4 02/11/2017 1538   GFRNONAA 108 02/11/2017 1538   GFRAA 125 02/11/2017 1538    Lipid Panel     Component Value Date/Time   CHOL 156 02/11/2017 1538   TRIG 69 02/11/2017 1538   HDL 63 02/11/2017 1538   CHOLHDL 2.5 02/11/2017 1538   LDLCALC 79 02/11/2017 1538    CBC    Component Value Date/Time  WBC 8.2 02/11/2017 1538   RBC 4.80 02/11/2017 1538   HGB 13.9 02/11/2017 1538   HCT 40.5 02/11/2017 1538   PLT 246 02/11/2017 1538   MCV 84 02/11/2017 1538   MCH 29.0 02/11/2017 1538   MCHC 34.3 02/11/2017 1538   RDW 14.2 02/11/2017 1538    Hgb A1C No results found for: HGBA1C          Assessment & Plan:   Preventative Health Maintenance:  Flu shot today Tetanus UTD Pap smear UTD- will request copy of most recent pap She wants to start screening mammograms at age 89 Encouraged her to consume a balanced diet and exercise regimen Advised her to see an eye doctor and dentist annually Will check CBC, CMET, Lipid, Vit D today  Night Sweats, Abnormal Weight Gain, Irregular Periods:  Could be perimenopausal Discussed why checking FSH/LH would not be indicated at this time Will check TSH She does not think she needs medication therapy for symptoms at this time Discussed menopause, and what she can expect  RTC in 1 year, sooner if needed Webb Silversmith, NP

## 2018-06-29 NOTE — Assessment & Plan Note (Signed)
Will update CSA and UDS today RX for Xanax refilled today

## 2018-07-03 LAB — PAIN MGMT, PROFILE 8 W/CONF, U
6 Acetylmorphine: NEGATIVE ng/mL (ref <10)
ALCOHOL METABOLITES: NEGATIVE ng/mL (ref <500)
ALPHAHYDROXYALPRAZOLAM: 67 ng/mL — AB (ref <25)
ALPHAHYDROXYMIDAZOLAM: NEGATIVE ng/mL (ref <50)
ALPHAHYDROXYTRIAZOLAM: NEGATIVE ng/mL (ref <50)
Aminoclonazepam: NEGATIVE ng/mL (ref <25)
Amphetamines: NEGATIVE ng/mL (ref <500)
Benzodiazepines: POSITIVE ng/mL — AB (ref <100)
Buprenorphine, Urine: NEGATIVE ng/mL (ref <5)
COCAINE METABOLITE: NEGATIVE ng/mL (ref <150)
Creatinine: 65.8 mg/dL
Hydroxyethylflurazepam: NEGATIVE ng/mL (ref <50)
LORAZEPAM: NEGATIVE ng/mL (ref <50)
MARIJUANA METABOLITE: POSITIVE ng/mL — AB (ref <20)
MDMA: NEGATIVE ng/mL (ref <500)
Marijuana Metabolite: 97 ng/mL — ABNORMAL HIGH (ref <5)
NORDIAZEPAM: NEGATIVE ng/mL (ref <50)
OXAZEPAM: NEGATIVE ng/mL (ref <50)
Opiates: NEGATIVE ng/mL (ref <100)
Oxidant: NEGATIVE ug/mL (ref <200)
Oxycodone: NEGATIVE ng/mL (ref <100)
Temazepam: NEGATIVE ng/mL (ref <50)
pH: 6.76 (ref 4.5–9.0)

## 2018-09-08 ENCOUNTER — Other Ambulatory Visit: Payer: Self-pay | Admitting: Internal Medicine

## 2018-09-08 NOTE — Telephone Encounter (Signed)
Last filled 06/29/18... please advise

## 2018-10-07 ENCOUNTER — Other Ambulatory Visit: Payer: Self-pay | Admitting: Internal Medicine

## 2018-10-07 NOTE — Telephone Encounter (Signed)
Both Rx last filled 06/28/18... please advise

## 2018-10-22 ENCOUNTER — Other Ambulatory Visit: Payer: Self-pay | Admitting: Internal Medicine

## 2018-10-22 NOTE — Telephone Encounter (Signed)
Last filled 09/08/2018... please advise

## 2018-11-29 ENCOUNTER — Other Ambulatory Visit: Payer: Self-pay | Admitting: Internal Medicine

## 2018-11-30 NOTE — Telephone Encounter (Signed)
Xanax last filled 10/24/2018, Tramadol and Flexeril last filled 10/08/2018...Marland Kitchen CPE 06/29/2018... please advise

## 2019-01-27 ENCOUNTER — Other Ambulatory Visit: Payer: Self-pay | Admitting: Internal Medicine

## 2019-01-27 NOTE — Telephone Encounter (Signed)
Last filled 11/30/2018.Marland KitchenMarland KitchenMarland Kitchen please advise

## 2019-02-25 ENCOUNTER — Other Ambulatory Visit: Payer: Self-pay | Admitting: Internal Medicine

## 2019-02-25 NOTE — Telephone Encounter (Signed)
Last filled 11/30/2018.... please advise 

## 2019-03-24 ENCOUNTER — Other Ambulatory Visit: Payer: Self-pay | Admitting: Internal Medicine

## 2019-03-24 NOTE — Telephone Encounter (Signed)
Last filled 01/27/2019... please advise last CPE 06/2018

## 2019-04-28 ENCOUNTER — Other Ambulatory Visit: Payer: Self-pay | Admitting: Internal Medicine

## 2019-04-29 NOTE — Telephone Encounter (Signed)
Last filled 02/27/2019... please advise

## 2019-06-01 ENCOUNTER — Other Ambulatory Visit: Payer: Self-pay | Admitting: Internal Medicine

## 2019-06-01 NOTE — Telephone Encounter (Signed)
Tramadol last filled 03/25/2019, Xanax last filled 04/29/2019.Marland KitchenMarland KitchenMarland Kitchen please advise

## 2019-07-11 ENCOUNTER — Other Ambulatory Visit: Payer: Self-pay | Admitting: Internal Medicine

## 2019-07-11 NOTE — Telephone Encounter (Signed)
Last filled 06/01/2019... please advise

## 2019-08-15 ENCOUNTER — Other Ambulatory Visit: Payer: Self-pay | Admitting: Internal Medicine

## 2019-08-15 NOTE — Telephone Encounter (Signed)
Xanax last filled 07/10/2018 and Tramadol last filled 06/01/2019... please advise

## 2019-09-30 ENCOUNTER — Other Ambulatory Visit: Payer: Self-pay | Admitting: Internal Medicine

## 2019-09-30 NOTE — Telephone Encounter (Signed)
Last filled 08/16/2019... note to pt to schedule CPE

## 2019-10-05 ENCOUNTER — Other Ambulatory Visit: Payer: Self-pay | Admitting: Internal Medicine

## 2019-10-05 NOTE — Telephone Encounter (Signed)
Last filled 11/2018... overdue CPE letter mailed... please advise

## 2019-10-10 ENCOUNTER — Other Ambulatory Visit: Payer: Self-pay | Admitting: Internal Medicine

## 2019-10-10 MED ORDER — CYCLOBENZAPRINE HCL 5 MG PO TABS: ORAL_TABLET | ORAL | 0 refills | Status: DC

## 2019-10-10 NOTE — Telephone Encounter (Signed)
Patient called back about refill request Advised she is due for her CPE /..  Patient scheduled 5/24

## 2019-10-10 NOTE — Telephone Encounter (Signed)
Pt has scheduled CPE.... please advise if okay to refill Flexeril now.Marland KitchenMarland Kitchen

## 2019-10-10 NOTE — Addendum Note (Signed)
Addended by: Lurlean Nanny on: 10/10/2019 02:14 PM   Modules accepted: Orders

## 2019-10-21 ENCOUNTER — Other Ambulatory Visit: Payer: Self-pay | Admitting: Internal Medicine

## 2019-10-28 ENCOUNTER — Emergency Department
Admission: EM | Admit: 2019-10-28 | Discharge: 2019-10-29 | Disposition: A | Discharge status: Home / Self Care | Payer: 59 | Attending: Emergency Medicine | Admitting: Emergency Medicine

## 2019-10-28 ENCOUNTER — Other Ambulatory Visit: Payer: Self-pay

## 2019-10-28 DIAGNOSIS — T7840XA Allergy, unspecified, initial encounter: Secondary | ICD-10-CM | POA: Diagnosis present

## 2019-10-28 MED ORDER — EPINEPHRINE 0.3 MG/0.3ML IJ SOAJ: 0.3000 mg | Freq: Once | INTRAMUSCULAR | Status: AC

## 2019-10-28 MED ORDER — EPINEPHRINE 0.3 MG/0.3ML IJ SOAJ: 0.3000 mg | INTRAMUSCULAR | 1 refills | Status: DC | PRN

## 2019-10-28 MED ORDER — METHYLPREDNISOLONE SODIUM SUCC 125 MG IJ SOLR
125.0000 mg | Freq: Once | INTRAMUSCULAR | Status: AC
  Administered 2019-10-28: 125 mg via INTRAVENOUS
  Filled 2019-10-28: qty 2

## 2019-10-28 MED ORDER — FAMOTIDINE IN NACL 20-0.9 MG/50ML-% IV SOLN
20.0000 mg | Freq: Once | INTRAVENOUS | Status: AC
  Administered 2019-10-28: 20 mg via INTRAVENOUS
  Filled 2019-10-28: qty 50

## 2019-10-28 MED ORDER — PREDNISONE 20 MG PO TABS: 40.0000 mg | ORAL_TABLET | Freq: Every day | ORAL | 0 refills | Status: DC

## 2019-10-28 MED ORDER — EPINEPHRINE 0.3 MG/0.3ML IJ SOAJ
INTRAMUSCULAR | Status: AC
  Administered 2019-10-28: 0.3 mg via INTRAMUSCULAR
  Filled 2019-10-28: qty 0.3

## 2019-10-28 MED ORDER — SODIUM CHLORIDE 0.9 % IV BOLUS
1000.0000 mL | Freq: Once | INTRAVENOUS | Status: AC
  Administered 2019-10-28: 1000 mL via INTRAVENOUS

## 2019-10-28 MED ORDER — LIDOCAINE VISCOUS HCL 2 % MT SOLN
15.0000 mL | Freq: Once | OROMUCOSAL | Status: AC
  Administered 2019-10-28: 15 mL via OROMUCOSAL
  Filled 2019-10-28: qty 15

## 2019-10-28 NOTE — ED Notes (Signed)
Epi pen given per MD Archie Balboa.

## 2019-10-28 NOTE — ED Provider Notes (Signed)
[facility] Emergency Department Provider Note  ____________________________________________   I have reviewed the triage vital signs and the nursing notes.   HISTORY  Chief Complaint Allergic Reaction   History limited by: Not Limited   HPI Dawn Conrad is a 44 y.o. female who presents to the emergency department today because of concerns for allergic reaction and throat tightening.  Patient states that she was at [facility] grass eating ham today when she started noticing her symptoms.  This happened a couple hours before presentation.  She does describe initially some whelps to her arms.  She had itchiness in her ears and noticed her throat swelling.  She took Benadryl at home.  While it helped with some of her symptoms did not help with her throat tightening.  Patient denies any significant allergies.   Records reviewed. Per medical record review patient has a history of seasonal rhinitis.   Past Medical History:  Diagnosis Date  . Allergy   . Anxiety   . Frequent headaches   . History of chicken pox     Patient Active Problem List   Diagnosis Date Noted  . Seasonal allergic rhinitis due to pollen 10/28/2016  . Anxiety 10/28/2016  . Chronic neck pain 10/28/2016    Past Surgical History:  Procedure Laterality Date  . KNEE SURGERY Right    bone spur removal    Prior to Admission medications   Medication Sig Start Date End Date Taking? Authorizing Provider  ALPRAZolam Duanne Moron) 0.5 MG tablet TAKE ONE TABLET BY MOUTH EVERY DAY AS NEEDED FOR ANXIETY 09/30/19   Jearld Fenton, NP  cyclobenzaprine (FLEXERIL) 5 MG tablet TAKE ONE TABLET BY MOUTH EVERY DAY AS NEEDED FOR MUSCLE SPASM 10/10/19   Jearld Fenton, NP  levonorgestrel (MIRENA) 20 MCG/24HR IUD 1 each by Intrauterine route once.    [provider]  traMADol (ULTRAM) 50 MG tablet TAKE 1 TABLET BY MOUTH EVERY 12 HOURS ASNEEDED 08/16/19   Jearld Fenton, NP    Allergies Codeine  Family  History  Problem Relation Age of Onset  . Arthritis Mother   . Arthritis Father   . Heart disease Father   . Arthritis Maternal Grandmother   . Arthritis Paternal Grandmother     Social History Social History   Tobacco Use  . Smoking status: Never Smoker  . Smokeless tobacco: Never Used  Substance Use Topics  . Alcohol use: Yes    Comment: occasional  . Drug use: No    Review of Systems Constitutional: No fever/chills Eyes: No visual changes. ENT: Throat swelling.  Cardiovascular: Denies chest pain. Respiratory: Denies shortness of breath. Gastrointestinal: No abdominal pain.  No nausea, no vomiting.  No diarrhea.   Genitourinary: Negative for dysuria. Musculoskeletal: Negative for back pain. Skin: Negative for rash. Neurological: Negative for headaches, focal weakness or numbness.  ____________________________________________   PHYSICAL EXAM:  VITAL SIGNS: ED Triage Vitals  Enc Vitals Group     BP 10/28/19 1743 (!) 161/90     Pulse Rate 10/28/19 1743 89     Resp 10/28/19 1743 18     Temp 10/28/19 1743 98.4 F (36.9 C)     Temp Source 10/28/19 1743 Oral     SpO2 10/28/19 1743 100 %     Weight 10/28/19 1744 140 lb (63.5 kg)     Height 10/28/19 1744 5\' 2"  (1.575 m)     Head Circumference --      Peak Flow --  Pain Score 10/28/19 1744 4   Constitutional: Alert and oriented.  Eyes: Conjunctivae are normal.  ENT      Head: Normocephalic and atraumatic.      Nose: No congestion/rhinnorhea.      Mouth/Throat: Mucous membranes are moist.      Neck: No stridor. Hematological/Lymphatic/Immunilogical: No cervical lymphadenopathy. Cardiovascular: Normal rate, regular rhythm.  No murmurs, rubs, or gallops.  Respiratory: Normal respiratory effort without tachypnea nor retractions. Breath sounds are clear and equal bilaterally. No wheezes/rales/rhonchi. Gastrointestinal: Soft and non tender. No rebound. No guarding.  Genitourinary: Deferred Musculoskeletal:  Normal range of motion in all extremities. No lower extremity edema. Neurologic:  Normal speech and language. No gross focal neurologic deficits are appreciated.  Skin:  Skin is warm, dry and intact. No rash noted. Psychiatric: Mood and affect are normal. Speech and behavior are normal. Patient exhibits appropriate insight and judgment.  ____________________________________________    LABS (pertinent positives/negatives)  None  ____________________________________________   EKG  None  ____________________________________________    RADIOLOGY  None  ____________________________________________   PROCEDURES  Procedures  ____________________________________________   INITIAL IMPRESSION / ASSESSMENT AND PLAN / ED COURSE  Pertinent labs & imaging results that were available during my care of the patient were reviewed by me and considered in my medical decision making (see chart for details).   Patient presented to the emergency department today because of concern for throat swelling and allergic reaction. Patient states that she did initially have hives when her symptoms started, took benadryl at home. At the time of my exam no hives appreciated. Patient did complain of throat swelling although was easily managing her secretions and no respiratory distress. However given concern for worsening symptoms patient was given dose of epinephrine. She was also give steroids and pepcid. Patient was observed in the emergency department for a number of hours with significant improvement of her symptoms. Will discharge home with further steroids and epi pen prescription.   ____________________________________________   FINAL CLINICAL IMPRESSION(S) / ED DIAGNOSES  Final diagnoses:  Allergic reaction, initial encounter     Note: This dictation was prepared with Dragon dictation. Any transcriptional errors that result from this process are unintentional     Nance Pear,  MD 10/29/19 [phone]

## 2019-10-28 NOTE — ED Triage Notes (Addendum)
Reports throat swelling and difficulty swallowing since 3PM today. States allergic reaction to possible grass seed or hay. Thick speech.

## 2019-10-28 NOTE — ED Notes (Signed)
First RN- pt ambulatory with reports that she was putting out straw and thinks shes having an allergic reaction. Pt reports difficulty breathing, throat swelling and hives.

## 2019-10-28 NOTE — Discharge Instructions (Addendum)
Please seek medical attention for any high fevers, chest pain, shortness of breath, change in behavior, persistent vomiting, bloody stool or any other new or concerning symptoms.  

## 2019-10-29 NOTE — ED Notes (Signed)
Peripheral IV discontinued. Catheter intact. No signs of infiltration or redness. Gauze applied to IV site.    Discharge instructions reviewed with patient. Questions fielded by this RN. Patient verbalizes understanding of instructions. Patient discharged home in stable condition per goodman. No acute distress noted at time of discharge.

## 2019-10-31 ENCOUNTER — Encounter: Payer: Self-pay | Admitting: Internal Medicine

## 2019-10-31 ENCOUNTER — Other Ambulatory Visit: Payer: Self-pay

## 2019-10-31 ENCOUNTER — Ambulatory Visit (INDEPENDENT_AMBULATORY_CARE_PROVIDER_SITE_OTHER): Payer: 59 | Admitting: Internal Medicine

## 2019-10-31 VITALS — BP 120/78 | HR 66 | Temp 98.2°F | Ht 63.0 in | Wt 143.0 lb

## 2019-10-31 DIAGNOSIS — M542 Cervicalgia: Secondary | ICD-10-CM | POA: Diagnosis not present

## 2019-10-31 DIAGNOSIS — Z0001 Encounter for general adult medical examination with abnormal findings: Secondary | ICD-10-CM

## 2019-10-31 DIAGNOSIS — Z1211 Encounter for screening for malignant neoplasm of colon: Secondary | ICD-10-CM

## 2019-10-31 DIAGNOSIS — Z79899 Other long term (current) drug therapy: Secondary | ICD-10-CM | POA: Diagnosis not present

## 2019-10-31 DIAGNOSIS — Z1231 Encounter for screening mammogram for malignant neoplasm of breast: Secondary | ICD-10-CM

## 2019-10-31 DIAGNOSIS — G8929 Other chronic pain: Secondary | ICD-10-CM | POA: Diagnosis not present

## 2019-10-31 DIAGNOSIS — F419 Anxiety disorder, unspecified: Secondary | ICD-10-CM | POA: Diagnosis not present

## 2019-10-31 DIAGNOSIS — Z0000 Encounter for general adult medical examination without abnormal findings: Secondary | ICD-10-CM

## 2019-10-31 LAB — CBC
HCT: 38.8 % (ref 36.0–46.0)
Hemoglobin: 12.7 g/dL (ref 12.0–15.0)
MCHC: 32.8 g/dL (ref 30.0–36.0)
MCV: 82.4 fl (ref 78.0–100.0)
Platelets: 254 10*3/uL (ref 150.0–400.0)
RBC: 4.7 Mil/uL (ref 3.87–5.11)
RDW: 19.3 % — ABNORMAL HIGH (ref 11.5–15.5)
WBC: 10.8 10*3/uL — ABNORMAL HIGH (ref 4.0–10.5)

## 2019-10-31 LAB — COMPREHENSIVE METABOLIC PANEL
ALT: 51 U/L — ABNORMAL HIGH (ref 0–35)
AST: 42 U/L — ABNORMAL HIGH (ref 0–37)
Albumin: 4.3 g/dL (ref 3.5–5.2)
Alkaline Phosphatase: 47 U/L (ref 39–117)
BUN: 16 mg/dL (ref 6–23)
CO2: 29 mEq/L (ref 19–32)
Calcium: 9.1 mg/dL (ref 8.4–10.5)
Chloride: 101 mEq/L (ref 96–112)
Creatinine, Ser: 0.55 mg/dL (ref 0.40–1.20)
GFR: 119.31 mL/min (ref >60.00)
Glucose, Bld: 93 mg/dL (ref 70–99)
Potassium: 3.7 mEq/L (ref 3.5–5.1)
Sodium: 137 mEq/L (ref 135–145)
Total Bilirubin: 0.4 mg/dL (ref 0.2–1.2)
Total Protein: 7.1 g/dL (ref 6.0–8.3)

## 2019-10-31 LAB — LIPID PANEL
Cholesterol: 173 mg/dL (ref 0–200)
HDL: 67.4 mg/dL (ref >39.00)
LDL Cholesterol: 90 mg/dL (ref 0–99)
NonHDL: 105.25
Total CHOL/HDL Ratio: 3
Triglycerides: 76 mg/dL (ref 0.0–149.0)
VLDL: 15.2 mg/dL (ref 0.0–40.0)

## 2019-10-31 LAB — VITAMIN D 25 HYDROXY (VIT D DEFICIENCY, FRACTURES): VITD: 51.41 ng/mL (ref 30.00–100.00)

## 2019-10-31 MED ORDER — CYCLOBENZAPRINE HCL 5 MG PO TABS: 5.0000 mg | ORAL_TABLET | Freq: Every day | ORAL | 0 refills | Status: DC | PRN

## 2019-10-31 MED ORDER — TRAMADOL HCL 50 MG PO TABS: 50.0000 mg | ORAL_TABLET | Freq: Every day | ORAL | 0 refills | Status: DC | PRN

## 2019-10-31 MED ORDER — ALPRAZOLAM 0.5 MG PO TABS: 0.5000 mg | ORAL_TABLET | Freq: Every day | ORAL | 0 refills | Status: DC | PRN

## 2019-10-31 NOTE — Assessment & Plan Note (Signed)
Continue Xanax as needed- discussed risk for addiction Advised her if she needed more frequently or felt like dose needed to be adjusted, that we may need to consider daily medication Support offered today CSA and UDS today

## 2019-10-31 NOTE — Assessment & Plan Note (Signed)
Increase Felxeril to 7.5 mg as needed Tramadol refilled today Continue stretching

## 2019-10-31 NOTE — Patient Instructions (Signed)
Health Maintenance, Female Adopting a healthy lifestyle and getting preventive care are important in promoting health and wellness. Ask your health care provider about:  The right schedule for you to have regular tests and exams.  Things you can do on your own to prevent diseases and keep yourself healthy. What should I know about diet, weight, and exercise? Eat a healthy diet   Eat a diet that includes plenty of vegetables, fruits, low-fat dairy products, and lean protein.  Do not eat a lot of foods that are high in solid fats, added sugars, or sodium. Maintain a healthy weight Body mass index (BMI) is used to identify weight problems. It estimates body fat based on height and weight. Your health care provider can help determine your BMI and help you achieve or maintain a healthy weight. Get regular exercise Get regular exercise. This is one of the most important things you can do for your health. Most adults should:  Exercise for at least 150 minutes each week. The exercise should increase your heart rate and make you sweat (moderate-intensity exercise).  Do strengthening exercises at least twice a week. This is in addition to the moderate-intensity exercise.  Spend less time sitting. Even light physical activity can be beneficial. Watch cholesterol and blood lipids Have your blood tested for lipids and cholesterol at 46 years of age, then have this test every 5 years. Have your cholesterol levels checked more often if:  Your lipid or cholesterol levels are high.  You are older than 46 years of age.  You are at high risk for heart disease. What should I know about cancer screening? Depending on your health history and family history, you may need to have cancer screening at various ages. This may include screening for:  Breast cancer.  Cervical cancer.  Colorectal cancer.  Skin cancer.  Lung cancer. What should I know about heart disease, diabetes, and high blood  pressure? Blood pressure and heart disease  High blood pressure causes heart disease and increases the risk of stroke. This is more likely to develop in people who have high blood pressure readings, are of African descent, or are overweight.  Have your blood pressure checked: ? Every 3-5 years if you are 18-39 years of age. ? Every year if you are 40 years old or older. Diabetes Have regular diabetes screenings. This checks your fasting blood sugar level. Have the screening done:  Once every three years after age 40 if you are at a normal weight and have a low risk for diabetes.  More often and at a younger age if you are overweight or have a high risk for diabetes. What should I know about preventing infection? Hepatitis B If you have a higher risk for hepatitis B, you should be screened for this virus. Talk with your health care provider to find out if you are at risk for hepatitis B infection. Hepatitis C Testing is recommended for:  Everyone born from 1945 through 1965.  Anyone with known risk factors for hepatitis C. Sexually transmitted infections (STIs)  Get screened for STIs, including gonorrhea and chlamydia, if: ? You are sexually active and are younger than 46 years of age. ? You are older than 46 years of age and your health care provider tells you that you are at risk for this type of infection. ? Your sexual activity has changed since you were last screened, and you are at increased risk for chlamydia or gonorrhea. Ask your health care provider if   you are at risk.  Ask your health care provider about whether you are at high risk for HIV. Your health care provider may recommend a prescription medicine to help prevent HIV infection. If you choose to take medicine to prevent HIV, you should first get tested for HIV. You should then be tested every 3 months for as long as you are taking the medicine. Pregnancy  If you are about to stop having your period (premenopausal) and  you may become pregnant, seek counseling before you get pregnant.  Take 400 to 800 micrograms (mcg) of folic acid every day if you become pregnant.  Ask for birth control (contraception) if you want to prevent pregnancy. Osteoporosis and menopause Osteoporosis is a disease in which the bones lose minerals and strength with aging. This can result in bone fractures. If you are 65 years old or older, or if you are at risk for osteoporosis and fractures, ask your health care provider if you should:  Be screened for bone loss.  Take a calcium or vitamin D supplement to lower your risk of fractures.  Be given hormone replacement therapy (HRT) to treat symptoms of menopause. Follow these instructions at home: Lifestyle  Do not use any products that contain nicotine or tobacco, such as cigarettes, e-cigarettes, and chewing tobacco. If you need help quitting, ask your health care provider.  Do not use street drugs.  Do not share needles.  Ask your health care provider for help if you need support or information about quitting drugs. Alcohol use  Do not drink alcohol if: ? Your health care provider tells you not to drink. ? You are pregnant, may be pregnant, or are planning to become pregnant.  If you drink alcohol: ? Limit how much you use to 0-1 drink a day. ? Limit intake if you are breastfeeding.  Be aware of how much alcohol is in your drink. In the [place] one drink equals one 12 oz bottle of beer (355 mL), one 5 oz glass of wine (148 mL), or one 1 oz glass of hard liquor (44 mL). General instructions  Schedule regular health, dental, and eye exams.  Stay current with your vaccines.  Tell your health care provider if: ? You often feel depressed. ? You have ever been abused or do not feel safe at home. Summary  Adopting a healthy lifestyle and getting preventive care are important in promoting health and wellness.  Follow your health care provider's instructions about healthy  diet, exercising, and getting tested or screened for diseases.  Follow your health care provider's instructions on monitoring your cholesterol and blood pressure. This information is not intended to replace advice given to you by your health care provider. Make sure you discuss any questions you have with your health care provider. Document Revised: 05/19/2018 Document Reviewed: 05/19/2018 Elsevier Patient Education  2020 [employer]  

## 2019-10-31 NOTE — Progress Notes (Signed)
Subjective:    Patient ID: Dawn Conrad, female    DOB: 05-Nov-1975, 44 y.o.   MRN: [id number]  HPI  Pt presents to the clinic today for her annual exam. She is also due to follow up chronic conditions.  Anxiety: Intermittent. She takes Xanax. She is not currently seeing a therapist. She denies depression, SI/HI. She is due for a CSA and UDS today.  Chronic Neck Pain: Managed on Flexeril and Tramadol. She does not feel like the Flexeril is effective that it used to be. She tried taking 10 mg but reports it made her too loopy. She would like a refill of Tramadol today. She is not seeing a chiropractor, but does stretch daily.  Flu: 06/2018 Tetanus: 02/2017 Pap Smear: 08/2016, [facility] Mammogram: 2015, [place] Colon Screening: never Vision Screening: yearly Dentist: biannually  Diet: She does eat meat. She consumes fruits and veggies daily. She tries to avoid fried foods. She drinks mostly water, wine. Exercise: None  Review of Systems      Past Medical History:  Diagnosis Date  . Allergy   . Anxiety   . Frequent headaches   . History of chicken pox     Current Outpatient Medications  Medication Sig Dispense Refill  . ALPRAZolam (XANAX) 0.5 MG tablet TAKE ONE TABLET BY MOUTH EVERY DAY AS NEEDED FOR ANXIETY 20 tablet 0  . cyclobenzaprine (FLEXERIL) 5 MG tablet TAKE ONE TABLET BY MOUTH EVERY DAY AS NEEDED FOR MUSCLE SPASM 20 tablet 0  . EPINEPHrine (EPIPEN 2-PAK) 0.3 mg/0.3 mL IJ SOAJ injection Inject 0.3 mLs (0.3 mg total) into the muscle as needed for anaphylaxis. 1 each 1  . levonorgestrel (MIRENA) 20 MCG/24HR IUD 1 each by Intrauterine route once.    . predniSONE (DELTASONE) 20 MG tablet Take 2 tablets (40 mg total) by mouth daily. 8 tablet 0  . traMADol (ULTRAM) 50 MG tablet TAKE 1 TABLET BY MOUTH EVERY 12 HOURS ASNEEDED 20 tablet 0   No current facility-administered medications for this visit.    Allergies  Allergen Reactions  . Codeine Other (See Comments)   "shakiness" per pt    Family History  Problem Relation Age of Onset  . Arthritis Mother   . Arthritis Father   . Heart disease Father   . Arthritis Maternal Grandmother   . Arthritis Paternal Grandmother     Social History   Socioeconomic History  . Marital status: Married    Spouse name: Not on file  . Number of children: Not on file  . Years of education: Not on file  . Highest education level: Not on file  Occupational History  . Not on file  Tobacco Use  . Smoking status: Never Smoker  . Smokeless tobacco: Never Used  Substance and Sexual Activity  . Alcohol use: Yes    Comment: occasional  . Drug use: No  . Sexual activity: Yes    Comment: mirena  Other Topics Concern  . Not on file  Social History Narrative  . Not on file   Social Determinants of Health   Financial Resource Strain:   . Difficulty of Paying Living Expenses:   Food Insecurity:   . Worried About Charity fundraiser in the Last Year:   . Arboriculturist in the Last Year:   Transportation Needs:   . Film/video editor (Medical):   Marland Kitchen Lack of Transportation (Non-Medical):   Physical Activity:   . Days of Exercise per Week:   .  Minutes of Exercise per Session:   Stress:   . Feeling of Stress :   Social Connections:   . Frequency of Communication with Friends and Family:   . Frequency of Social Gatherings with Friends and Family:   . Attends Religious Services:   . Active Member of Clubs or Organizations:   . Attends Archivist Meetings:   Marland Kitchen Marital Status:   Intimate Partner Violence:   . Fear of Current or Ex-Partner:   . Emotionally Abused:   Marland Kitchen Physically Abused:   . Sexually Abused:      Constitutional: Denies fever, malaise, fatigue, headache or abrupt weight changes.  HEENT: Denies eye pain, eye redness, ear pain, ringing in the ears, wax buildup, runny nose, nasal congestion, bloody nose, or sore throat. Respiratory: Denies difficulty breathing, shortness of  breath, cough or sputum production.   Cardiovascular: Denies chest pain, chest tightness, palpitations or swelling in the hands or feet.  Gastrointestinal: Denies abdominal pain, bloating, constipation, diarrhea or blood in the stool.  GU: Pt reports irregular periods. Denies urgency, frequency, pain with urination, burning sensation, blood in urine, odor or discharge. Musculoskeletal: Pt reports chronic neck pain. Denies decrease in range of motion, difficulty with gait, or joint swelling.  Skin: Denies redness, rashes, lesions or ulcercations.  Neurological: Denies dizziness, difficulty with memory, difficulty with speech or problems with balance and coordination.  Psych: Pt has a history of anxiety. Denies depression, SI/HI.  No other specific complaints in a complete review of systems (except as listed in HPI above).  Objective:   Physical Exam  BP 120/78   Pulse 66   Temp 98.2 F (36.8 C) (Temporal)   Ht 5\' 3"  (1.6 m)   Wt 143 lb (64.9 kg)   SpO2 98%   BMI 25.33 kg/m   Wt Readings from Last 3 Encounters:  10/28/19 140 lb (63.5 kg)  06/29/18 145 lb (65.8 kg)  02/11/17 132 lb (59.9 kg)    General: Appears her stated age, well developed, well nourished in NAD. Skin: Warm, dry and intact. No rashesnoted. HEENT: Head: normal shape and size; Eyes: sclera white, no icterus, conjunctiva pink, PERRLA and EOMs intact;  Neck:  Neck supple, trachea midline. No masses, lumps or thyromegaly present.  Cardiovascular: Normal rate and rhythm. S1,S2 noted.  No murmur, rubs or gallops noted. No JVD or BLE edema.  Pulmonary/Chest: Normal effort and positive vesicular breath sounds. No respiratory distress. No wheezes, rales or ronchi noted.  Abdomen: Soft and nontender. Normal bowel sounds. No distention or masses noted. Liver, spleen and kidneys non palpable. Musculoskeletal: Strength 5/5 BUE/BLE. No difficulty with gait.  Neurological: Alert and oriented. Cranial nerves II-XII grossly  intact. Coordination normal.  Psychiatric: Mood and affect normal. Behavior is normal. Judgment and thought content normal.    BMET    Component Value Date/Time   NA 137 06/29/2018 0904   NA 140 02/11/2017 1538   K 4.5 06/29/2018 0904   CL 105 06/29/2018 0904   CO2 26 06/29/2018 0904   GLUCOSE 90 06/29/2018 0904   BUN 18 06/29/2018 0904   BUN 11 02/11/2017 1538   CREATININE 0.69 06/29/2018 0904   CALCIUM 9.0 06/29/2018 0904   GFRNONAA 108 02/11/2017 1538   GFRAA 125 02/11/2017 1538    Lipid Panel     Component Value Date/Time   CHOL 161 06/29/2018 0904   CHOL 156 02/11/2017 1538   TRIG 41.0 06/29/2018 0904   HDL 60.80 06/29/2018 0904   HDL  63 02/11/2017 1538   CHOLHDL 3 06/29/2018 0904   VLDL 8.2 06/29/2018 0904   LDLCALC 92 06/29/2018 0904   LDLCALC 79 02/11/2017 1538    CBC    Component Value Date/Time   WBC 6.5 06/29/2018 0904   RBC 5.09 06/29/2018 0904   HGB 11.9 (L) 06/29/2018 0904   HGB 13.9 02/11/2017 1538   HCT 36.9 06/29/2018 0904   HCT 40.5 02/11/2017 1538   PLT 318.0 06/29/2018 0904   PLT 246 02/11/2017 1538   MCV 72.5 (L) 06/29/2018 0904   MCV 84 02/11/2017 1538   MCH 29.0 02/11/2017 1538   MCHC 32.2 06/29/2018 0904   RDW 22.0 (H) 06/29/2018 0904   RDW 14.2 02/11/2017 1538    Hgb A1C No results found for: HGBA1C         Assessment & Plan:  Preventative Health Maintenance:  Encouraged her to get a flu shot in the fall Tetanus UTD Pap smear UTD, will request copy. Mammogram ordered- she will call to schedule Referral to GI for screening colonoscopy Encouraged her to consume a balanced diet and exercise regimen Advised her to see an eye doctor and dentist annually Will check CBC, CMET, Lipid, and Vit D today  RTC in 1 year, sooner if needed Webb Silversmith, NP This visit occurred during the SARS-CoV-2 public health emergency.  Safety protocols were in place, including screening questions prior to the visit, additional usage of staff  PPE, and extensive cleaning of exam room while observing appropriate contact time as indicated for disinfecting solutions.

## 2019-11-01 ENCOUNTER — Telehealth (INDEPENDENT_AMBULATORY_CARE_PROVIDER_SITE_OTHER): Payer: Self-pay | Admitting: Gastroenterology

## 2019-11-01 DIAGNOSIS — Z1211 Encounter for screening for malignant neoplasm of colon: Secondary | ICD-10-CM

## 2019-11-01 NOTE — Progress Notes (Signed)
Gastroenterology Pre-Procedure Review  Request Date: Tuesday 11/15/19 Requesting Physician: Dr. Vicente Males  PATIENT REVIEW QUESTIONS: The patient responded to the following health history questions as indicated:    1. Are you having any GI issues? no 2. Do you have a personal history of Polyps? no 3. Do you have a family history of Colon Cancer or Polyps? no 4. Diabetes Mellitus? no 5. Joint replacements in the past 12 months?no 6. Major health problems in the past 3 months?Allergic reaction to hay 10/28/19 ER visit 7. Any artificial heart valves, MVP, or defibrillator?no    MEDICATIONS & ALLERGIES:    Patient reports the following regarding taking any anticoagulation/antiplatelet therapy:   Plavix, Coumadin, Eliquis, Xarelto, Lovenox, Pradaxa, Brilinta, or Effient? no Aspirin? no  Patient confirms/reports the following medications:  Current Outpatient Medications  Medication Sig Dispense Refill  . ALPRAZolam (XANAX) 0.5 MG tablet Take 1 tablet (0.5 mg total) by mouth daily as needed for anxiety. 20 tablet 0  . cyclobenzaprine (FLEXERIL) 5 MG tablet Take 1 tablet (5 mg total) by mouth daily as needed for muscle spasms. TAKE ONE TABLET BY MOUTH EVERY DAY AS NEEDED FOR MUSCLE SPASM 30 tablet 0  . EPINEPHrine (EPIPEN 2-PAK) 0.3 mg/0.3 mL IJ SOAJ injection Inject 0.3 mLs (0.3 mg total) into the muscle as needed for anaphylaxis. 1 each 1  . levonorgestrel (MIRENA) 20 MCG/24HR IUD 1 each by Intrauterine route once.    . traMADol (ULTRAM) 50 MG tablet Take 1 tablet (50 mg total) by mouth daily as needed. 20 tablet 0  . neomycin-polymyxin b-dexamethasone (MAXITROL) 3.5-10000-0.1 SUSP PLACE 1 DROP ONTO OUTSIDE OF RIGHT EYE LID 2-3 TIMES A DAY FOR 2 WEEKS     No current facility-administered medications for this visit.    Patient confirms/reports the following allergies:  Allergies  Allergen Reactions  . Codeine Other (See Comments)    "shakiness" per pt    No orders of the defined types were  placed in this encounter.   AUTHORIZATION INFORMATION Primary Insurance: 1D#: Group #:  Secondary Insurance: 1D#: Group #:  SCHEDULE INFORMATION: Date:  Time: Location:  Patient has decided that she does not want to have her colonoscopy at this time due to mandatory COVID Testing.  Thanks,  [place], [place]

## 2019-11-02 ENCOUNTER — Telehealth: Payer: Self-pay | Admitting: Internal Medicine

## 2019-11-02 DIAGNOSIS — Z1211 Encounter for screening for malignant neoplasm of colon: Secondary | ICD-10-CM

## 2019-11-02 DIAGNOSIS — R7989 Other specified abnormal findings of blood chemistry: Secondary | ICD-10-CM

## 2019-11-02 LAB — DRUG MONITORING, PANEL 8 WITH CONFIRMATION, URINE
6 Acetylmorphine: NEGATIVE ng/mL (ref <10)
Alcohol Metabolites: POSITIVE ng/mL — AB
Amphetamines: NEGATIVE ng/mL (ref <500)
Benzodiazepines: NEGATIVE ng/mL (ref <100)
Buprenorphine, Urine: NEGATIVE ng/mL (ref <5)
Cocaine Metabolite: NEGATIVE ng/mL (ref <150)
Creatinine: 38 mg/dL
Ethyl Glucuronide (ETG): 1022 ng/mL — ABNORMAL HIGH (ref <500)
Ethyl Sulfate (ETS): 256 ng/mL — ABNORMAL HIGH (ref <100)
MDMA: NEGATIVE ng/mL (ref <500)
Marijuana Metabolite: 515 ng/mL — ABNORMAL HIGH (ref <5)
Marijuana Metabolite: POSITIVE ng/mL — AB (ref <20)
Opiates: NEGATIVE ng/mL (ref <100)
Oxidant: NEGATIVE ug/mL
Oxycodone: NEGATIVE ng/mL (ref <100)
pH: 7.2 (ref 4.5–9.0)

## 2019-11-02 LAB — DM TEMPLATE

## 2019-11-02 NOTE — Telephone Encounter (Signed)
Would she want to do Cologuard or at the very least IFOB?

## 2019-11-02 NOTE — Telephone Encounter (Signed)
Patient called.  Patient was referred for a Colonoscopy.  When patient was called to schedule the Colonoscopy, they told her there would be a mandatory covid test before the procedure.  Patient said she doesn't want to have the test done, so she's going to put off having the Colonoscopy done.

## 2019-11-03 ENCOUNTER — Encounter: Payer: Self-pay | Admitting: Internal Medicine

## 2019-11-08 NOTE — Telephone Encounter (Signed)
Pt is agreeable to try the Cologuard and will send order... pt will check with her insurance in the meantime to see if it is covered.Marland KitchenMarland Kitchen

## 2019-11-08 NOTE — Addendum Note (Signed)
Addended by: Lindalou Hose Y on: 11/08/2019 05:00 PM   Modules accepted: Orders

## 2019-11-11 NOTE — Addendum Note (Signed)
Addended by: Lurlean Nanny on: 11/11/2019 11:25 AM   Modules accepted: Orders

## 2019-11-11 NOTE — Telephone Encounter (Signed)
Cologuard order faxed and pt is aware

## 2019-12-02 ENCOUNTER — Other Ambulatory Visit: Payer: 59

## 2019-12-05 ENCOUNTER — Other Ambulatory Visit (INDEPENDENT_AMBULATORY_CARE_PROVIDER_SITE_OTHER): Payer: 59

## 2019-12-05 DIAGNOSIS — R945 Abnormal results of liver function studies: Secondary | ICD-10-CM | POA: Diagnosis not present

## 2019-12-05 DIAGNOSIS — R7989 Other specified abnormal findings of blood chemistry: Secondary | ICD-10-CM

## 2019-12-05 LAB — HEPATIC FUNCTION PANEL
ALT: 15 U/L (ref 0–35)
AST: 16 U/L (ref 0–37)
Albumin: 4.2 g/dL (ref 3.5–5.2)
Alkaline Phosphatase: 39 U/L (ref 39–117)
Bilirubin, Direct: 0 mg/dL (ref 0.0–0.3)
Total Bilirubin: 0.3 mg/dL (ref 0.2–1.2)
Total Protein: 6.7 g/dL (ref 6.0–8.3)

## 2019-12-16 ENCOUNTER — Encounter: Payer: Self-pay | Admitting: Internal Medicine

## 2019-12-25 ENCOUNTER — Encounter: Payer: Self-pay | Admitting: Internal Medicine

## 2019-12-26 ENCOUNTER — Emergency Department: Payer: No Typology Code available for payment source

## 2019-12-26 ENCOUNTER — Encounter: Payer: Self-pay | Admitting: Emergency Medicine

## 2019-12-26 ENCOUNTER — Other Ambulatory Visit: Payer: Self-pay

## 2019-12-26 ENCOUNTER — Emergency Department
Admission: EM | Admit: 2019-12-26 | Discharge: 2019-12-26 | Disposition: A | Discharge status: Home / Self Care | Payer: No Typology Code available for payment source | Attending: Emergency Medicine | Admitting: Emergency Medicine

## 2019-12-26 DIAGNOSIS — R0602 Shortness of breath: Secondary | ICD-10-CM | POA: Diagnosis present

## 2019-12-26 DIAGNOSIS — R05 Cough: Secondary | ICD-10-CM | POA: Diagnosis not present

## 2019-12-26 DIAGNOSIS — U071 COVID-19: Secondary | ICD-10-CM | POA: Diagnosis not present

## 2019-12-26 LAB — CBC WITH DIFFERENTIAL/PLATELET
Abs Immature Granulocytes: 0.07 10*3/uL (ref 0.00–0.07)
Basophils Absolute: 0 10*3/uL (ref 0.0–0.1)
Basophils Relative: 0 %
Eosinophils Absolute: 0 10*3/uL (ref 0.0–0.5)
Eosinophils Relative: 0 %
HCT: 43.9 % (ref 36.0–46.0)
Hemoglobin: 15.5 g/dL — ABNORMAL HIGH (ref 12.0–15.0)
Immature Granulocytes: 1 %
Lymphocytes Relative: 8 %
Lymphs Abs: 0.7 10*3/uL (ref 0.7–4.0)
MCH: 28.1 pg (ref 26.0–34.0)
MCHC: 35.3 g/dL (ref 30.0–36.0)
MCV: 79.7 fL — ABNORMAL LOW (ref 80.0–100.0)
Monocytes Absolute: 0.4 10*3/uL (ref 0.1–1.0)
Monocytes Relative: 4 %
Neutro Abs: 8.6 10*3/uL — ABNORMAL HIGH (ref 1.7–7.7)
Neutrophils Relative %: 87 %
Platelets: 330 10*3/uL (ref 150–400)
RBC: 5.51 MIL/uL — ABNORMAL HIGH (ref 3.87–5.11)
RDW: 15.6 % — ABNORMAL HIGH (ref 11.5–15.5)
Smear Review: NORMAL
WBC: 9.8 10*3/uL (ref 4.0–10.5)
nRBC: 0 % (ref 0.0–0.2)

## 2019-12-26 LAB — COMPREHENSIVE METABOLIC PANEL
ALT: 23 U/L (ref 0–44)
AST: 34 U/L (ref 15–41)
Albumin: 3.7 g/dL (ref 3.5–5.0)
Alkaline Phosphatase: 55 U/L (ref 38–126)
Anion gap: 13 (ref 5–15)
BUN: 8 mg/dL (ref 6–20)
CO2: 25 mmol/L (ref 22–32)
Calcium: 8.9 mg/dL (ref 8.9–10.3)
Chloride: 94 mmol/L — ABNORMAL LOW (ref 98–111)
Creatinine, Ser: 0.66 mg/dL (ref 0.44–1.00)
GFR calc Af Amer: >60 mL/min (ref >60)
GFR calc non Af Amer: >60 mL/min (ref >60)
Glucose, Bld: 111 mg/dL — ABNORMAL HIGH (ref 70–99)
Potassium: 3.7 mmol/L (ref 3.5–5.1)
Sodium: 132 mmol/L — ABNORMAL LOW (ref 135–145)
Total Bilirubin: 0.5 mg/dL (ref 0.3–1.2)
Total Protein: 8.5 g/dL — ABNORMAL HIGH (ref 6.5–8.1)

## 2019-12-26 LAB — TROPONIN I (HIGH SENSITIVITY): Troponin I (High Sensitivity): 5 ng/L (ref <18)

## 2019-12-26 LAB — SARS CORONAVIRUS 2 BY RT PCR (HOSPITAL ORDER, PERFORMED IN ~~LOC~~ HOSPITAL LAB): SARS Coronavirus 2: POSITIVE — AB

## 2019-12-26 LAB — POCT PREGNANCY, URINE
Preg Test, Ur: NEGATIVE
Preg Test, Ur: NEGATIVE

## 2019-12-26 LAB — PROCALCITONIN: Procalcitonin: 0.38 ng/mL

## 2019-12-26 MED ORDER — PREDNISONE 10 MG PO TABS
10.0000 mg | ORAL_TABLET | Freq: Every day | ORAL | 0 refills | Status: DC
Start: 2019-12-26 — End: 2020-01-16

## 2019-12-26 MED ORDER — ONDANSETRON HCL 4 MG PO TABS: 4.0000 mg | ORAL_TABLET | Freq: Every day | ORAL | 0 refills | Status: DC | PRN

## 2019-12-26 MED ORDER — IOHEXOL 350 MG/ML SOLN
75.0000 mL | Freq: Once | INTRAVENOUS | Status: AC | PRN
  Administered 2019-12-26: 75 mL via INTRAVENOUS
  Filled 2019-12-26: qty 75

## 2019-12-26 MED ORDER — TRAMADOL HCL 50 MG PO TABS: 50.0000 mg | ORAL_TABLET | Freq: Four times a day (QID) | ORAL | 0 refills | Status: DC | PRN

## 2019-12-26 NOTE — ED Notes (Signed)
Pt does not want covid swab unless CTA requires it.

## 2019-12-26 NOTE — ED Notes (Signed)
Date and time results received: 12/26/19   Test: covid Critical Value: positive  Name of Provider Notified: [email or web address]  Orders Received? Or Actions Taken?: MD notified

## 2019-12-26 NOTE — ED Triage Notes (Signed)
Reports c/o intermittent sob, back pain, chest pain since May, after having a reaction to straw.  Patient is AAOx3.  Skin warm and dry. NAD

## 2019-12-26 NOTE — Discharge Instructions (Addendum)
As we discussed please obtain a pulse oximeter from any drugstore or [employer], etc.  Please use this several times per day and return to the emergency department if your oxygen level drops below 90%, if your shortness of breath worsens or develop acute onset of chest pain, or any other symptom personally concerning to yourself.  Please isolate at home for the next 10 days.  Household contacts should isolate as well, especially if any symptoms should develop.

## 2019-12-26 NOTE — ED Provider Notes (Signed)
[facility] Emergency Department Provider Note  Time seen: 1:59 PM  I have reviewed the triage vital signs and the nursing notes.   HISTORY  Chief Complaint Shortness of Breath   HPI Dawn Conrad is a 44 y.o. female with a past medical history of anxiety, presents to the emergency department for chest pain or shortness of breath.  According to the patient for the past 2 weeks she has been coughing with a sensation of mild chest pain and shortness of breath.  Patient denies any fever.  States occasional sputum production with a cough.  Patient relates her symptoms to may when she had an allergic reaction to Straw although does admit that she felt better until 2 weeks ago when she began coughing once again.  Patient has not received Covid vaccinations.   Past Medical History:  Diagnosis Date  . Allergy   . Anxiety   . Frequent headaches   . History of chicken pox     Patient Active Problem List   Diagnosis Date Noted  . Seasonal allergic rhinitis due to pollen 10/28/2016  . Anxiety 10/28/2016  . Chronic neck pain 10/28/2016    Past Surgical History:  Procedure Laterality Date  . KNEE SURGERY Right    bone spur removal    Prior to Admission medications   Medication Sig Start Date End Date Taking? Authorizing Provider  ALPRAZolam Duanne Moron) 0.5 MG tablet Take 1 tablet (0.5 mg total) by mouth daily as needed for anxiety. 10/31/19   Jearld Fenton, NP  cyclobenzaprine (FLEXERIL) 5 MG tablet Take 1 tablet (5 mg total) by mouth daily as needed for muscle spasms. TAKE ONE TABLET BY MOUTH EVERY DAY AS NEEDED FOR MUSCLE SPASM 10/31/19   Jearld Fenton, NP  EPINEPHrine (EPIPEN 2-PAK) 0.3 mg/0.3 mL IJ SOAJ injection Inject 0.3 mLs (0.3 mg total) into the muscle as needed for anaphylaxis. 10/28/19   Nance Pear, MD  levonorgestrel (MIRENA) 20 MCG/24HR IUD 1 each by Intrauterine route once.    [provider]  neomycin-polymyxin b-dexamethasone  (MAXITROL) 3.5-10000-0.1 SUSP PLACE 1 DROP ONTO OUTSIDE OF RIGHT EYE LID 2-3 TIMES A DAY FOR 2 WEEKS 06/17/19   [provider]  traMADol (ULTRAM) 50 MG tablet Take 1 tablet (50 mg total) by mouth daily as needed. 10/31/19   Jearld Fenton, NP    Allergies  Allergen Reactions  . Other Other (See Comments)    Patient states she is allergic to Hay/Straw.  This was the cause of her ER visit. Throat swelling.  . Codeine Other (See Comments)    "shakiness" per pt    Family History  Problem Relation Age of Onset  . Arthritis Mother   . Arthritis Father   . Heart disease Father   . Arthritis Maternal Grandmother   . Arthritis Paternal Grandmother     Social History Social History   Tobacco Use  . Smoking status: Never Smoker  . Smokeless tobacco: Never Used  Substance Use Topics  . Alcohol use: Yes    Comment: occasional  . Drug use: No    Review of Systems Constitutional: Negative for fever Cardiovascular: Occasional chest pain worse with inspiration. Respiratory: Mild shortness of breath.  Frequent dry cough. Gastrointestinal: Negative for abdominal pain, vomiting Genitourinary: Negative for urinary compaints Musculoskeletal: Negative for musculoskeletal complaints Neurological: Negative for headache All other ROS negative  ____________________________________________   PHYSICAL EXAM:  VITAL SIGNS: ED Triage Vitals  Enc Vitals Group  BP 12/26/19 0914 124/83     Pulse Rate 12/26/19 0914 93     Resp 12/26/19 0914 18     Temp 12/26/19 0914 97.7 F (36.5 C)     Temp Source 12/26/19 0914 Oral     SpO2 12/26/19 0914 97 %     Weight 12/26/19 0914 131 lb (59.4 kg)     Height 12/26/19 0914 5\' 2"  (1.575 m)     Head Circumference --      Peak Flow --      Pain Score 12/26/19 0931 5     Pain Loc --      Pain Edu? --      Excl. in [place]? --     Constitutional: Alert and oriented. Well appearing and in no distress. Eyes: Normal exam ENT      Head:  Normocephalic and atraumatic.      Mouth/Throat: Mucous membranes are moist. Cardiovascular: Normal rate, regular rhythm. Respiratory: Normal respiratory effort without tachypnea nor retractions. Breath sounds are clear  Gastrointestinal: Soft and nontender. No distention. Musculoskeletal: Nontender with normal range of motion in all extremities. No lower extremity tenderness or edema. Neurologic:  Normal speech and language. No gross focal neurologic deficits  Skin:  Skin is warm, dry and intact.  Psychiatric: Mood and affect are normal  ____________________________________________    EKG  EKG viewed and interpreted by myself shows a normal sinus rhythm 88 bpm with a narrow QRS, normal axis, normal intervals, no concerning ST changes.  ____________________________________________    RADIOLOGY  Chest x-ray shows bilateral patchy opacities.  CT shows multifocal pneumonia likely viral  ____________________________________________   INITIAL IMPRESSION / ASSESSMENT AND PLAN / ED COURSE  Pertinent labs & imaging results that were available during my care of the patient were reviewed by me and considered in my medical decision making (see chart for details).   Patient presents emergency department for 2 weeks of cough shortness of breath and now chest pain worse with deep inspiration.  Patient's initial lab work is concerning for chest x-ray showing patchy bilateral opacities.  Patient's white blood cell count is normal.  EKG is reassuring.  We will proceed with CTA imaging of the chest to rule out pulmonary embolism and to better evaluate her bilateral opacities.  I have also added on a Covid swab.  CTA negative for PE but does show multifocal pneumonia likely viral in origin.  Patient's Covid test is positive.  I discussed with the patient inpatient versus outpatient therapy strongly wishes to go home.  We will discharge with a steroid taper given the patient's shortness of breath and  history of allergies.  We will prescribe Zofran to be used if needed as well as a pain medication for her chest discomfort.  Patient states she has used Ultram in the past without any difficulty or allergic reaction.  I also discussed using a pulse oximeter at home and returning for any pulse oximeter reading less than 90 or for any increase in shortness of breath or development of acute onset of chest pain.  Patient agreeable to plan of care.  Dawn Conrad was evaluated in Emergency Department on 12/26/2019 for the symptoms described in the history of present illness. She was evaluated in the context of the global COVID-19 pandemic, which necessitated consideration that the patient might be at risk for infection with the SARS-CoV-2 virus that causes COVID-19. Institutional protocols and algorithms that pertain to the evaluation of patients at risk for COVID-19 are  in a state of rapid change based on information released by regulatory bodies including the CDC and federal and state organizations. These policies and algorithms were followed during the patient's care in the ED.  ____________________________________________   FINAL CLINICAL IMPRESSION(S) / ED DIAGNOSES  Cough SOB COVID-19   Harvest Dark, MD 12/26/19 1720

## 2020-01-02 ENCOUNTER — Other Ambulatory Visit: Payer: Self-pay | Admitting: Internal Medicine

## 2020-01-02 NOTE — Telephone Encounter (Signed)
Last filled 10/31/2019...Marland Kitchen please advise

## 2020-01-03 ENCOUNTER — Encounter: Payer: Self-pay | Admitting: Internal Medicine

## 2020-01-03 ENCOUNTER — Telehealth: Payer: Self-pay | Admitting: Internal Medicine

## 2020-01-03 NOTE — Telephone Encounter (Signed)
Patient called in stating she was recently in the hospital due to testing positive for covid. Patient did have a scan while in the hospital and would like to have another one done, due to now being out. Please advise.

## 2020-01-16 ENCOUNTER — Other Ambulatory Visit: Payer: Self-pay

## 2020-01-16 ENCOUNTER — Telehealth (INDEPENDENT_AMBULATORY_CARE_PROVIDER_SITE_OTHER): Payer: No Typology Code available for payment source | Admitting: Internal Medicine

## 2020-01-16 ENCOUNTER — Encounter: Payer: Self-pay | Admitting: Internal Medicine

## 2020-01-16 DIAGNOSIS — G479 Sleep disorder, unspecified: Secondary | ICD-10-CM | POA: Diagnosis not present

## 2020-01-16 DIAGNOSIS — F419 Anxiety disorder, unspecified: Secondary | ICD-10-CM

## 2020-01-16 DIAGNOSIS — U071 COVID-19: Secondary | ICD-10-CM

## 2020-01-16 DIAGNOSIS — J189 Pneumonia, unspecified organism: Secondary | ICD-10-CM

## 2020-01-16 MED ORDER — TEMAZEPAM 7.5 MG PO CAPS
7.5000 mg | ORAL_CAPSULE | Freq: Every evening | ORAL | 0 refills | Status: DC | PRN
Start: 2020-01-16 — End: 2021-02-07

## 2020-01-16 MED ORDER — ALPRAZOLAM 0.5 MG PO TABS: ORAL_TABLET | ORAL | 0 refills | Status: DC

## 2020-01-16 NOTE — Progress Notes (Addendum)
Subjective:  Virtual Visit via Video Note  I connected with Dawn Conrad on 01/25/20 at 12:15 PM EDT by a video enabled telemedicine application and verified that I am speaking with the correct person using two identifiers.  Location: Patient: Home Provider: Office   I discussed the limitations of evaluation and management by telemedicine and the availability of in person appointments. The patient expressed understanding and agreed to proceed.  Persons participating in this visit: Webb Silversmith, NP Oneita Hurt.   Patient ID: Dawn Conrad, female    DOB: 16-Oct-1975, 44 y.o.   MRN: [id number]  HPI  Patient presents to the clinic today for ER follow-up.  She went to the ER 7/19 with complaint of chest pain and shortness of breath that had started 2 weeks prior.  Chest x-ray was concerning for multifocal viral pneumonia.  CTA of the chest was negative for PE.  ECG was normal.  Her Covid test was positive.  She was given prescriptions for Zofran, Prednisone and Tramadol.  She was discharged and advised to follow-up with her PCP.  Since discharge, she reports she feels better than she has in a while. She would like to schedule a repeat chest xray.  She also reports since discharge, she has stopped smoking THC which has helped control her anxiety. She has been taking her Xanax BID for anxiety and sleep. She is not currently seeing a therapist. She denies depression, SI/HI.   Review of Systems      Past Medical History:  Diagnosis Date  . Allergy   . Anxiety   . Frequent headaches   . History of chicken pox     Current Outpatient Medications  Medication Sig Dispense Refill  . ALPRAZolam (XANAX) 0.5 MG tablet TAKE ONE TABLET DAILY IF NEEDED FOR ANXIETY 20 tablet 0  . cyclobenzaprine (FLEXERIL) 5 MG tablet Take 1 tablet (5 mg total) by mouth daily as needed for muscle spasms. TAKE ONE TABLET BY MOUTH EVERY DAY AS NEEDED FOR MUSCLE SPASM 30 tablet 0  . EPINEPHrine (EPIPEN  2-PAK) 0.3 mg/0.3 mL IJ SOAJ injection Inject 0.3 mLs (0.3 mg total) into the muscle as needed for anaphylaxis. 1 each 1  . levonorgestrel (MIRENA) 20 MCG/24HR IUD 1 each by Intrauterine route once.    . neomycin-polymyxin b-dexamethasone (MAXITROL) 3.5-10000-0.1 SUSP PLACE 1 DROP ONTO OUTSIDE OF RIGHT EYE LID 2-3 TIMES A DAY FOR 2 WEEKS    . ondansetron (ZOFRAN) 4 MG tablet Take 1 tablet (4 mg total) by mouth daily as needed for nausea or vomiting. 20 tablet 0  . predniSONE (DELTASONE) 10 MG tablet Take 1 tablet (10 mg total) by mouth daily. Day 1-3: take 4 tablets PO daily Day 4-6: take 3 tablets PO daily Day 7-9: take 2 tablets PO daily Day 10-12: take 1 tablet PO daily 30 tablet 0  . traMADol (ULTRAM) 50 MG tablet Take 1 tablet (50 mg total) by mouth every 6 (six) hours as needed. 20 tablet 0   No current facility-administered medications for this visit.    Allergies  Allergen Reactions  . Other Other (See Comments)    Patient states she is allergic to Hay/Straw.  This was the cause of her ER visit. Throat swelling.  . Codeine Other (See Comments)    "shakiness" per pt    Family History  Problem Relation Age of Onset  . Arthritis Mother   . Arthritis Father   . Heart disease Father   . Arthritis Maternal Grandmother   .  Arthritis Paternal Grandmother     Social History   Socioeconomic History  . Marital status: Married    Spouse name: Not on file  . Number of children: Not on file  . Years of education: Not on file  . Highest education level: Not on file  Occupational History  . Not on file  Tobacco Use  . Smoking status: Never Smoker  . Smokeless tobacco: Never Used  Substance and Sexual Activity  . Alcohol use: Yes    Comment: occasional  . Drug use: No  . Sexual activity: Yes    Comment: mirena  Other Topics Concern  . Not on file  Social History Narrative  . Not on file   Social Determinants of Health   Financial Resource Strain:   . Difficulty of  Paying Living Expenses:   Food Insecurity:   . Worried About Charity fundraiser in the Last Year:   . Arboriculturist in the Last Year:   Transportation Needs:   . Film/video editor (Medical):   Marland Kitchen Lack of Transportation (Non-Medical):   Physical Activity:   . Days of Exercise per Week:   . Minutes of Exercise per Session:   Stress:   . Feeling of Stress :   Social Connections:   . Frequency of Communication with Friends and Family:   . Frequency of Social Gatherings with Friends and Family:   . Attends Religious Services:   . Active Member of Clubs or Organizations:   . Attends Archivist Meetings:   Marland Kitchen Marital Status:   Intimate Partner Violence:   . Fear of Current or Ex-Partner:   . Emotionally Abused:   Marland Kitchen Physically Abused:   . Sexually Abused:      Constitutional: Denies fever, malaise, fatigue, headache or abrupt weight changes.  HEENT: Denies eye pain, eye redness, ear pain, ringing in the ears, wax buildup, runny nose, nasal congestion, bloody nose, or sore throat. Respiratory: Denies difficulty breathing, shortness of breath, cough or sputum production.   Cardiovascular: Denies chest pain, chest tightness, palpitations or swelling in the hands or feet.  Gastrointestinal: Denies abdominal pain, bloating, constipation, diarrhea or blood in the stool.  Neurological: Pt reports sleep disturbance. Denies dizziness, difficulty with memory, difficulty with speech or problems with balance and coordination.  Psych: Pt reports anxiety. Denies depression, SI/HI.  No other specific complaints in a complete review of systems (except as listed in HPI above).  Objective:   Physical Exam   Wt Readings from Last 3 Encounters:  12/26/19 131 lb (59.4 kg)  10/31/19 143 lb (64.9 kg)  10/28/19 140 lb (63.5 kg)    General: Appears her stated age, well developed, well nourished in NAD. HEENT: Head: normal shape and size; Nose: no congestion noted; Throat/Mouth: no  hoarseness noted.  Pulmonary/Chest: Normal effort. No respiratory distress.  Neurological: Alert and oriented.  Psychiatric: Mood and affect normal. Behavior is normal. Judgment and thought content normal.     BMET    Component Value Date/Time   NA 132 (L) 12/26/2019 0920   NA 140 02/11/2017 1538   K 3.7 12/26/2019 0920   CL 94 (L) 12/26/2019 0920   CO2 25 12/26/2019 0920   GLUCOSE 111 (H) 12/26/2019 0920   BUN 8 12/26/2019 0920   BUN 11 02/11/2017 1538   CREATININE 0.66 12/26/2019 0920   CALCIUM 8.9 12/26/2019 0920   GFRNONAA >60 12/26/2019 0920   GFRAA >60 12/26/2019 0920    Lipid Panel  Component Value Date/Time   CHOL 173 10/31/2019 0929   CHOL 156 02/11/2017 1538   TRIG 76.0 10/31/2019 0929   HDL 67.40 10/31/2019 0929   HDL 63 02/11/2017 1538   CHOLHDL 3 10/31/2019 0929   VLDL 15.2 10/31/2019 0929   LDLCALC 90 10/31/2019 0929   LDLCALC 79 02/11/2017 1538    CBC    Component Value Date/Time   WBC 9.8 12/26/2019 0920   RBC 5.51 (H) 12/26/2019 0920   HGB 15.5 (H) 12/26/2019 0920   HGB 13.9 02/11/2017 1538   HCT 43.9 12/26/2019 0920   HCT 40.5 02/11/2017 1538   PLT 330 12/26/2019 0920   PLT 246 02/11/2017 1538   MCV 79.7 (L) 12/26/2019 0920   MCV 84 02/11/2017 1538   MCH 28.1 12/26/2019 0920   MCHC 35.3 12/26/2019 0920   RDW 15.6 (H) 12/26/2019 0920   RDW 14.2 02/11/2017 1538   LYMPHSABS 0.7 12/26/2019 0920   MONOABS 0.4 12/26/2019 0920   EOSABS 0.0 12/26/2019 0920   BASOSABS 0.0 12/26/2019 0920    Hgb A1C No results found for: HGBA1C          Assessment & Plan:   ER Follow Up for Covid 19, Pneumonia:  ER notes, labs and imaging reviewed Symptoms resolved after Prednisone Future chest xray ordered  Sleep Disturbance, Anxiety:  Advised her I did not want her taking Xanax BID due to addictive properties Refilled early, advised her to try 1/2 tab during the day RX for Temazepam 7.5 mg QHS prn  Return precautions discussed  Follow  Up Instructions:    I discussed the assessment and treatment plan with the patient. The patient was provided an opportunity to ask questions and all were answered. The patient agreed with the plan and demonstrated an understanding of the instructions.   The patient was advised to call back or seek an in-person evaluation if the symptoms worsen or if the condition fails to improve as anticipated.  Webb Silversmith, NP This visit occurred during the SARS-CoV-2 public health emergency.  Safety protocols were in place, including screening questions prior to the visit, additional usage of staff PPE, and extensive cleaning of exam room while observing appropriate contact time as indicated for disinfecting solutions.

## 2020-01-16 NOTE — Patient Instructions (Signed)

## 2020-01-17 ENCOUNTER — Telehealth: Payer: Self-pay

## 2020-01-17 NOTE — Telephone Encounter (Signed)
Dawn Conrad with [facility] left v/m that has xanax 0.5 mg refill that is too early to fill. Pt advised [facility] that Webb Silversmith NP wrote the xanax rx early and Altha Harm request cb to confirm that. per virtual visit note on 01/16/20 there is a notation filling the xanax early but advised pt to try taking 1/2 tab during the day. Dawn Conrad request cb after reviewed. On med list xanax 0.5 mg has take one tab daily prn for anxiety.

## 2020-01-17 NOTE — Telephone Encounter (Signed)
Advised her I did not want her taking Xanax BID due to addictive properties Refilled early, advised her to try 1/2 tab during the day RX for Temazepam 7.5 mg QHS prn  Per my last note, ok to refill early, take 1/2 tab instead 1 tab daily.

## 2020-01-18 ENCOUNTER — Encounter: Payer: Self-pay | Admitting: Internal Medicine

## 2020-01-18 NOTE — Telephone Encounter (Signed)
recording came on saying everyone was busy, Instructed to lmovm... Verbal okay and authorization for early fill left on VM

## 2020-01-26 ENCOUNTER — Ambulatory Visit (INDEPENDENT_AMBULATORY_CARE_PROVIDER_SITE_OTHER)
Admission: RE | Admit: 2020-01-26 | Discharge: 2020-01-26 | Disposition: A | Discharge status: Home / Self Care | Payer: No Typology Code available for payment source | Source: Ambulatory Visit | Attending: Internal Medicine | Admitting: Internal Medicine

## 2020-01-26 ENCOUNTER — Encounter: Payer: Self-pay | Admitting: Internal Medicine

## 2020-01-26 DIAGNOSIS — U071 COVID-19: Secondary | ICD-10-CM

## 2020-01-26 DIAGNOSIS — J189 Pneumonia, unspecified organism: Secondary | ICD-10-CM | POA: Diagnosis not present

## 2020-01-27 ENCOUNTER — Other Ambulatory Visit: Payer: Self-pay | Admitting: Internal Medicine

## 2020-01-27 NOTE — Telephone Encounter (Signed)
Last filled from ED 12/26/2019... please advise if appropriate

## 2020-03-07 ENCOUNTER — Other Ambulatory Visit: Payer: Self-pay | Admitting: Internal Medicine

## 2020-03-07 NOTE — Telephone Encounter (Signed)
Last filled 01/16/2020... please advise

## 2020-04-12 ENCOUNTER — Other Ambulatory Visit: Payer: Self-pay | Admitting: Internal Medicine

## 2020-04-13 NOTE — Telephone Encounter (Signed)
Alprazolam last filled 03-07-20 #20 Tramadol last filled 01-27-20 #20 Last OV 01-16-20 Acute VV No Future OV [facility]

## 2020-05-23 ENCOUNTER — Other Ambulatory Visit: Payer: Self-pay | Admitting: Internal Medicine

## 2020-05-23 NOTE — Telephone Encounter (Signed)
Last filled 04/13/2020...Marland Kitchen please advise

## 2020-07-06 ENCOUNTER — Other Ambulatory Visit: Payer: Self-pay | Admitting: Internal Medicine

## 2020-07-06 NOTE — Telephone Encounter (Signed)
Xanax last filled 05/24/2020... Tramadol last filled 04/13/2020... please advise

## 2020-08-06 ENCOUNTER — Other Ambulatory Visit: Payer: Self-pay | Admitting: Internal Medicine

## 2020-08-06 NOTE — Telephone Encounter (Signed)
Xanax and Tramadol last filled 07/08/2020... please advise

## 2020-09-16 IMAGING — CT CT ANGIO CHEST
2 of 6 series · 18 of 46 positions shown · IV contrast (APPLIED)
Comparison: Chest radiograph dated 12/26/2019.

CLINICAL DATA: 43-year-old female with chest pain.

EXAM:
CT ANGIOGRAPHY CHEST WITH CONTRAST
TECHNIQUE: Multidetector CT imaging of the chest was performed using the
standard protocol during bolus administration of intravenous
contrast. Multiplanar CT image reconstructions and MIPs were
obtained to evaluate the vascular anatomy.
CONTRAST:  75mL OMNIPAQUE IOHEXOL 350 MG/ML SOLN

[Series 7: thins · axial · 0.68mm/px · z∈[-372,-141]mm · 15 of 255 slices shown]
[im 12/255  lung]
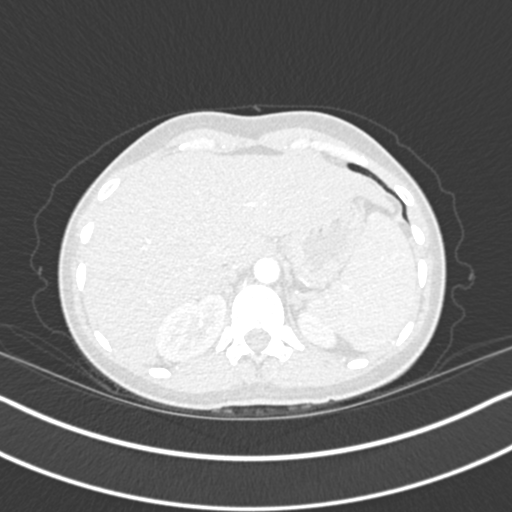
[im 34/255  soft-tissue]
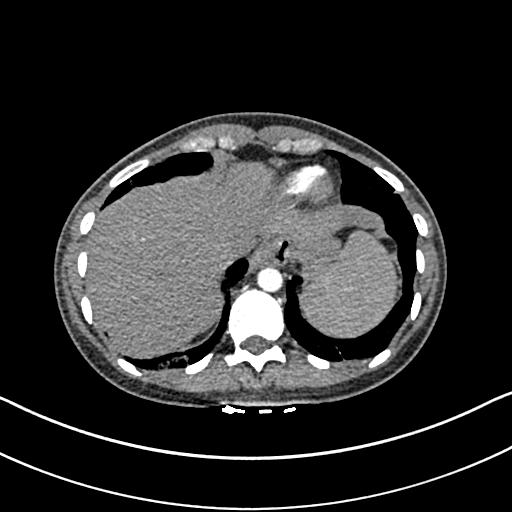
[im 45/255  lung]
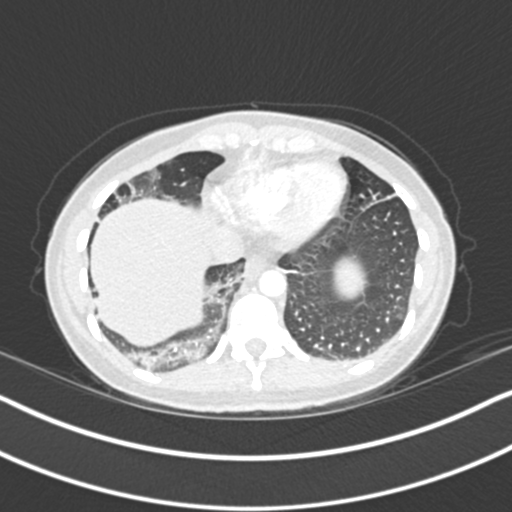
[im 67/255  soft-tissue]
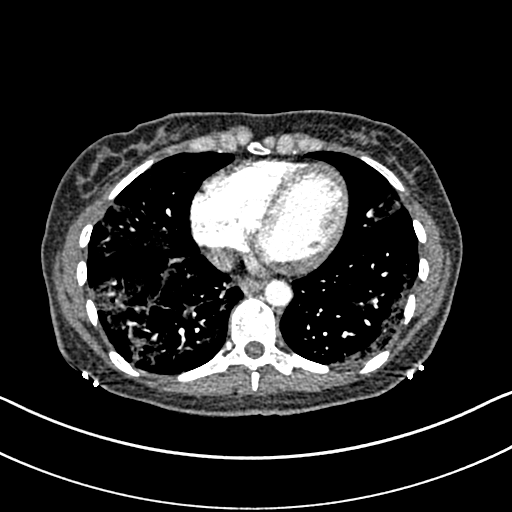
[im 78/255  lung]
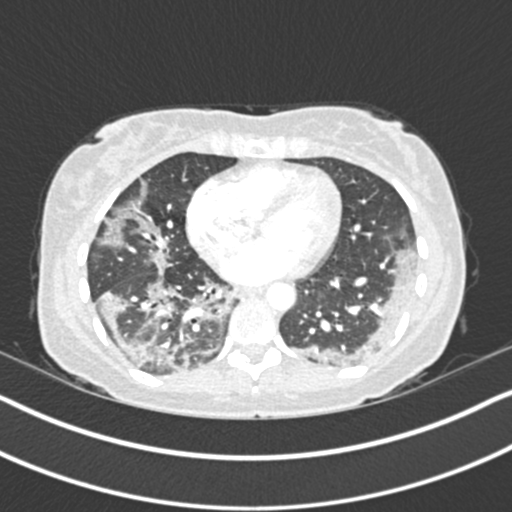
[im 100/255  soft-tissue]
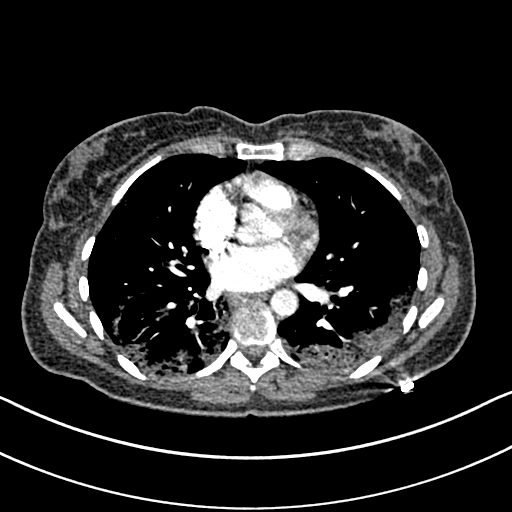
[im 111/255  lung]
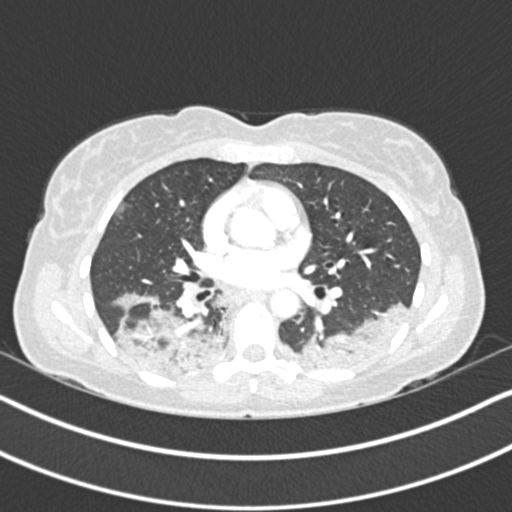
[im 133/255  soft-tissue]
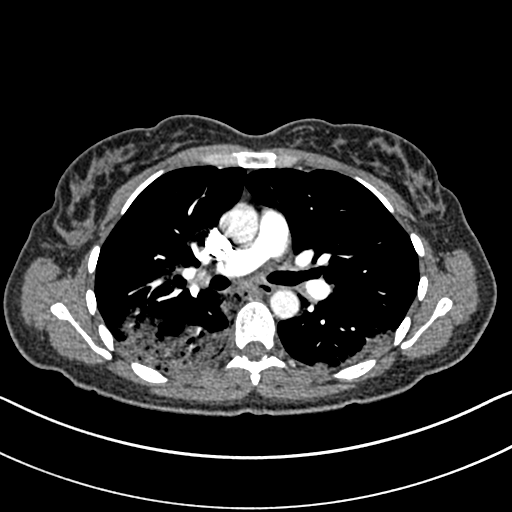
[im 144/255  lung]
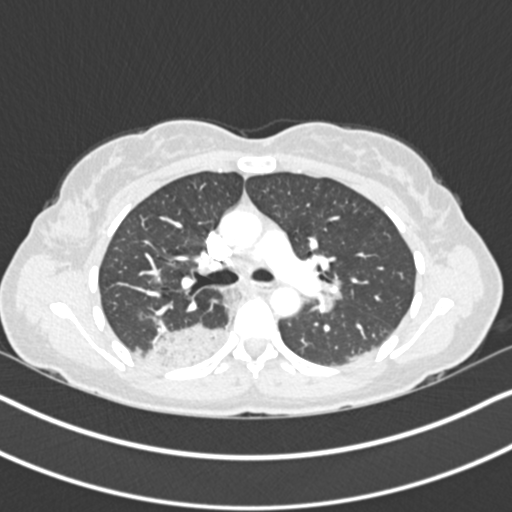
[im 155/255  soft-tissue]
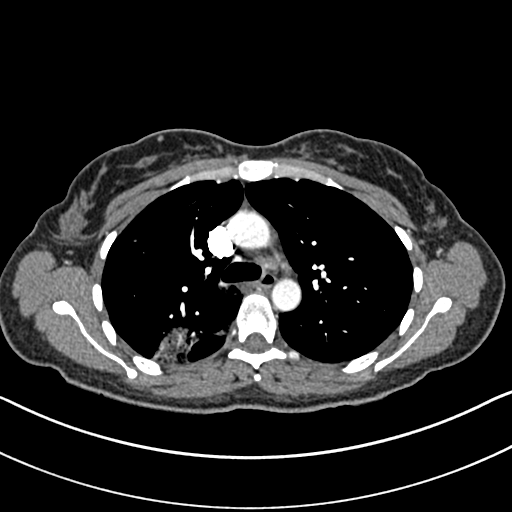
[im 177/255  lung]
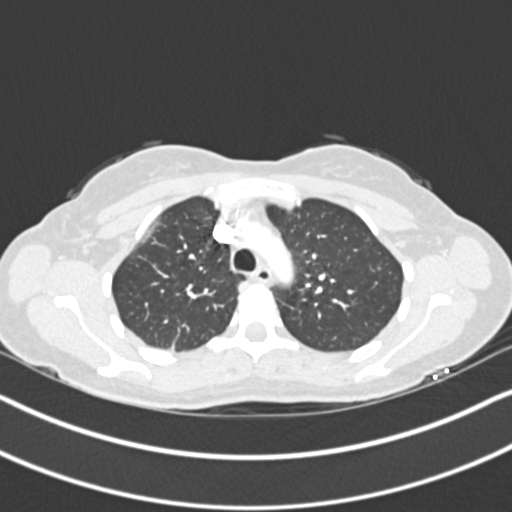
[im 188/255  soft-tissue]
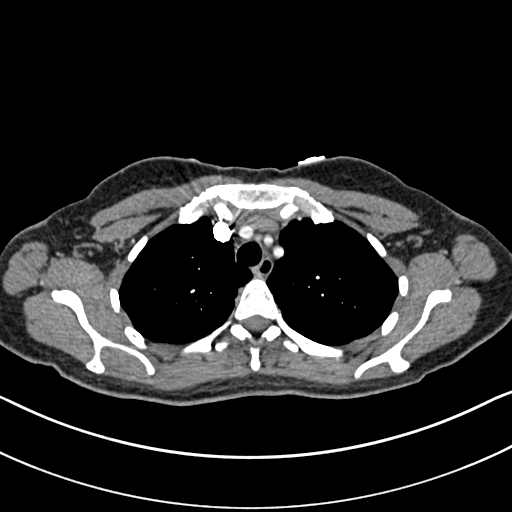
[im 210/255  lung]
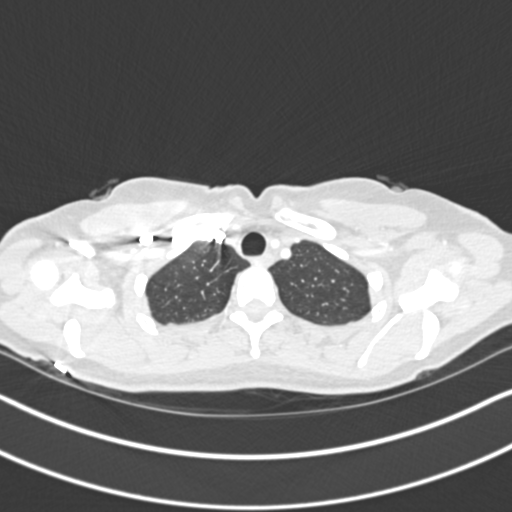
[im 221/255  soft-tissue]
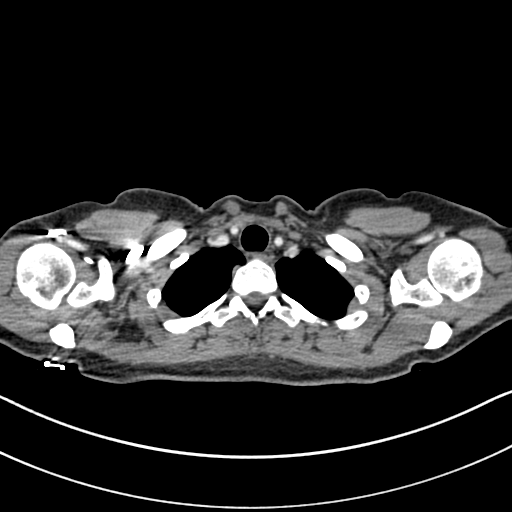
[im 243/255  lung]
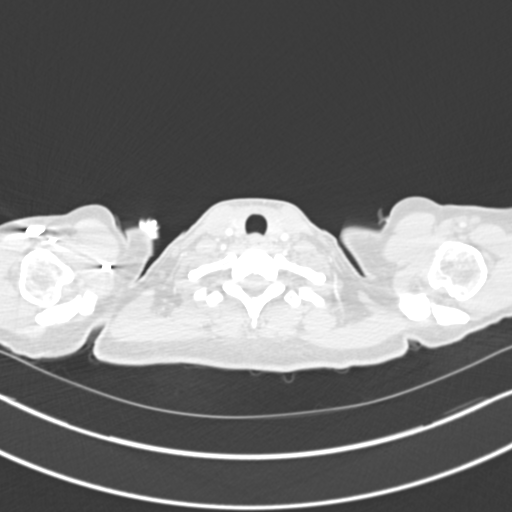

[Series 9: coronal mpr · coronal · 0.50mm/px · 3 of 76 slices shown]
[im 19/76  soft-tissue]
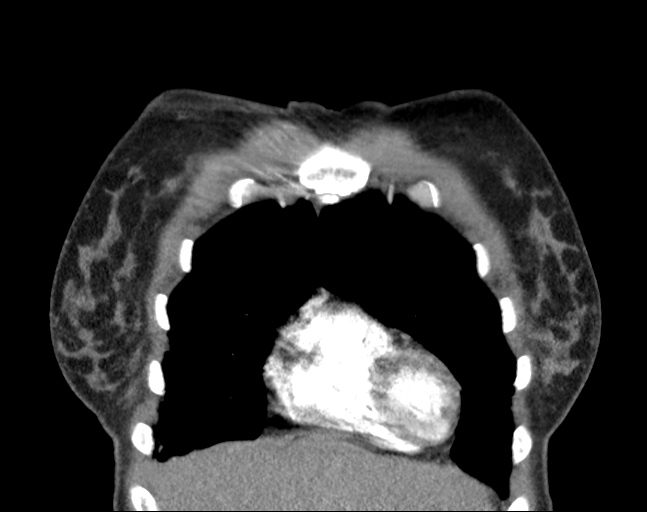
[im 38/76  soft-tissue]
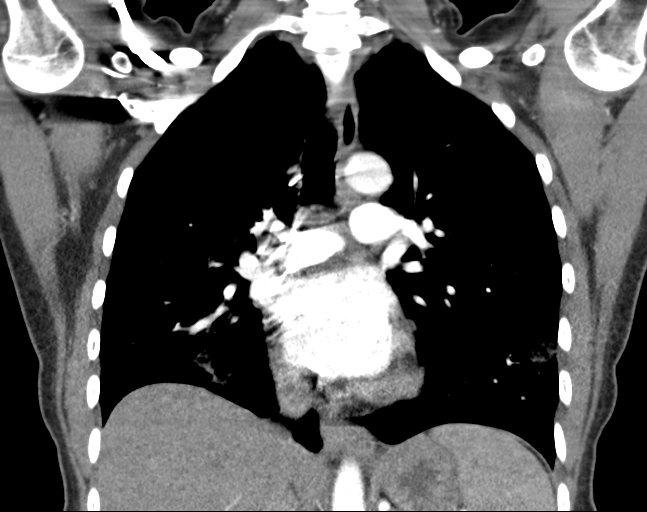
[im 57/76  soft-tissue]
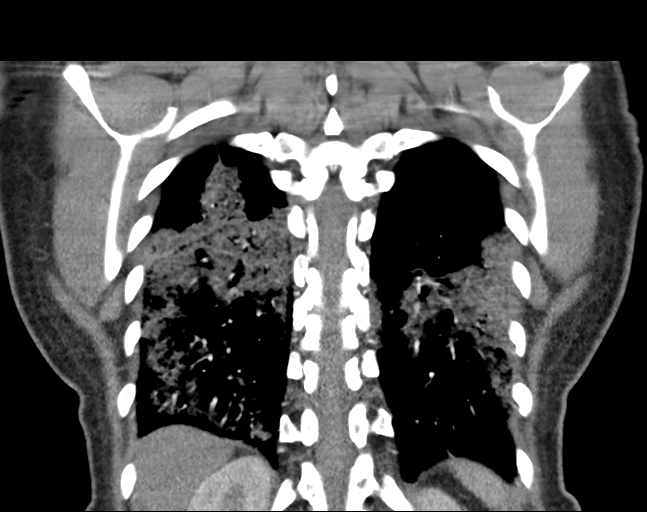

[18 of 46 positions shown; findings below may reference images not displayed]

FINDINGS: Evaluation of this exam is limited due to respiratory motion
artifact.

Cardiovascular: There is no cardiomegaly or pericardial effusion.
The thoracic aorta is unremarkable. The origins of the great vessels
of the aortic arch appear patent as visualized. No pulmonary artery
embolus identified.

Mediastinum/Nodes: Mildly enlarged right hilar lymph node measures 1
cm. The esophagus is grossly unremarkable. No mediastinal fluid
collection.

Lungs/Pleura: Bilateral peripheral and subpleural patchy
ground-glass opacities most consistent with pneumonia, likely
atypical or viral in etiology including [id number]. Clinical
correlation is recommended. There is no pleural effusion or
pneumothorax. The central airways are patent.

Upper Abdomen: No acute abnormality.

Musculoskeletal: No chest wall abnormality. No acute or significant
osseous findings.

Review of the MIP images confirms the above findings.
IMPRESSION: 1. No CT evidence of pulmonary embolism.
2. Multifocal pneumonia, likely atypical or viral in etiology.
Clinical correlation is recommended.
3. Mildly enlarged right hilar lymph node, likely reactive.

## 2020-10-23 ENCOUNTER — Other Ambulatory Visit: Payer: Self-pay | Admitting: Internal Medicine

## 2020-10-26 NOTE — Telephone Encounter (Signed)
Pt called and is requesting to have this sent to the pharmacy as she is going out of town this weekend. Please advise.

## 2020-12-06 ENCOUNTER — Other Ambulatory Visit: Payer: Self-pay | Admitting: Internal Medicine

## 2021-02-06 ENCOUNTER — Encounter: Payer: Self-pay | Admitting: Internal Medicine

## 2021-02-06 ENCOUNTER — Other Ambulatory Visit: Payer: Self-pay

## 2021-02-06 ENCOUNTER — Ambulatory Visit (INDEPENDENT_AMBULATORY_CARE_PROVIDER_SITE_OTHER): Payer: No Typology Code available for payment source | Admitting: Internal Medicine

## 2021-02-06 ENCOUNTER — Other Ambulatory Visit (HOSPITAL_COMMUNITY)
Admission: RE | Admit: 2021-02-06 | Discharge: 2021-02-06 | Disposition: A | Discharge status: Home / Self Care | Payer: No Typology Code available for payment source | Source: Ambulatory Visit | Attending: Internal Medicine | Admitting: Internal Medicine

## 2021-02-06 VITALS — BP 112/63 | HR 62 | Temp 97.5°F | Resp 17 | Ht 62.0 in | Wt 127.2 lb

## 2021-02-06 DIAGNOSIS — Z1159 Encounter for screening for other viral diseases: Secondary | ICD-10-CM | POA: Diagnosis not present

## 2021-02-06 DIAGNOSIS — Z0001 Encounter for general adult medical examination with abnormal findings: Secondary | ICD-10-CM | POA: Diagnosis not present

## 2021-02-06 DIAGNOSIS — F419 Anxiety disorder, unspecified: Secondary | ICD-10-CM | POA: Diagnosis not present

## 2021-02-06 DIAGNOSIS — Z1231 Encounter for screening mammogram for malignant neoplasm of breast: Secondary | ICD-10-CM

## 2021-02-06 DIAGNOSIS — G43839 Menstrual migraine, intractable, without status migrainosus: Secondary | ICD-10-CM | POA: Diagnosis not present

## 2021-02-06 DIAGNOSIS — Z124 Encounter for screening for malignant neoplasm of cervix: Secondary | ICD-10-CM | POA: Diagnosis present

## 2021-02-06 DIAGNOSIS — M542 Cervicalgia: Secondary | ICD-10-CM

## 2021-02-06 DIAGNOSIS — Z1211 Encounter for screening for malignant neoplasm of colon: Secondary | ICD-10-CM | POA: Diagnosis not present

## 2021-02-06 DIAGNOSIS — F5101 Primary insomnia: Secondary | ICD-10-CM

## 2021-02-06 DIAGNOSIS — G8929 Other chronic pain: Secondary | ICD-10-CM

## 2021-02-06 NOTE — Progress Notes (Addendum)
Subjective:    Patient ID: Dawn Conrad, female    DOB: 1975-07-26, 45 y.o.   MRN: [id number]  HPI  Patient presents the clinic today for her annual exam.  She is also due to follow-up chronic conditions.  Anxiety: Intermittent.  She takes Xanax as needed with good relief of symptoms.  She is not currently seeing a therapist.  She denies depression, SI/HI.  Chronic Neck Pain: She treats with Flexeril and Tramadol during flares.  She is not currently seeing an orthopedist.  Frequent Headaches: Only occur around her menstrual cycle. She takes Tylenol as needed with good relief.   Insomnia: Currently not an issue. She isnot taking any medications for this. There is no sleep study on file.  Flu: 06/2018 Tetanus: 02/2017 COVID: never Pap smear: 03/2016 Mammogram: > 2 years ago Colon screening: never Vision screening: as needed Dentist: biannually  Diet: She does eat meat. She consumes fruits and veggies. She tries to avoid fried foods. She drinks mostly water, coffee. Exercise: None  Review of Systems     Past Medical History:  Diagnosis Date   Allergy    Anxiety    Frequent headaches    History of chicken pox     Current Outpatient Medications  Medication Sig Dispense Refill   ALPRAZolam (XANAX) 0.5 MG tablet TAKE 1/2 TO 1 TABLET ONCE A DAY 20 tablet 0   cyclobenzaprine (FLEXERIL) 5 MG tablet TAKE ONE TABLET EVERY DAY AS NEEDED FOR MUSCLE SPASM 30 tablet 0   EPINEPHrine (EPIPEN 2-PAK) 0.3 mg/0.3 mL IJ SOAJ injection Inject 0.3 mLs (0.3 mg total) into the muscle as needed for anaphylaxis. 1 each 1   levonorgestrel (MIRENA) 20 MCG/24HR IUD 1 each by Intrauterine route once.     neomycin-polymyxin b-dexamethasone (MAXITROL) 3.5-10000-0.1 SUSP PLACE 1 DROP ONTO OUTSIDE OF RIGHT EYE LID 2-3 TIMES A DAY FOR 2 WEEKS     ondansetron (ZOFRAN) 4 MG tablet Take 1 tablet (4 mg total) by mouth daily as needed for nausea or vomiting. 20 tablet 0   temazepam (RESTORIL) 7.5 MG capsule  Take 1 capsule (7.5 mg total) by mouth at bedtime as needed for sleep. 30 capsule 0   traMADol (ULTRAM) 50 MG tablet TAKE ONE TABLET EVERY DAY AS NEEDED 20 tablet 0   No current facility-administered medications for this visit.    Allergies  Allergen Reactions   Other Other (See Comments)    Patient states she is allergic to Hay/Straw.  This was the cause of her ER visit. Throat swelling.   Codeine Other (See Comments)    "shakiness" per pt    Family History  Problem Relation Age of Onset   Arthritis Mother    Arthritis Father    Heart disease Father    Arthritis Maternal Grandmother    Arthritis Paternal Grandmother     Social History   Socioeconomic History   Marital status: Married    Spouse name: Not on file   Number of children: Not on file   Years of education: Not on file   Highest education level: Not on file  Occupational History   Not on file  Tobacco Use   Smoking status: Never   Smokeless tobacco: Never  Substance and Sexual Activity   Alcohol use: Yes    Comment: occasional   Drug use: No   Sexual activity: Yes    Comment: mirena  Other Topics Concern   Not on file  Social History Narrative   Not  on file   Social Determinants of Health   Financial Resource Strain: Not on file  Food Insecurity: Not on file  Transportation Needs: Not on file  Physical Activity: Not on file  Stress: Not on file  Social Connections: Not on file  Intimate Partner Violence: Not on file     Constitutional: Pt reports menstrual headaches. Denies fever, malaise, fatigue, or abrupt weight changes.  HEENT: Denies eye pain, eye redness, ear pain, ringing in the ears, wax buildup, runny nose, nasal congestion, bloody nose, or sore throat. Respiratory: Denies difficulty breathing, shortness of breath, cough or sputum production.   Cardiovascular: Pt reports daily nausea, decreased appetite. Denies chest pain, chest tightness, palpitations or swelling in the hands or feet.   Gastrointestinal: Denies abdominal pain, bloating, constipation, diarrhea or blood in the stool.  GU: Denies urgency, frequency, pain with urination, burning sensation, blood in urine, odor or discharge. Musculoskeletal: Patient reports intermittent neck pain.  Denies decrease in range of motion, difficulty with gait, or joint swelling.  Skin: Denies redness, rashes, lesions or ulcercations.  Neurological: Patient reports insomnia.  Denies dizziness, difficulty with memory, difficulty with speech or problems with balance and coordination.  Psych: Patient has a history of anxiety.  Denies depression, SI/HI.  No other specific complaints in a complete review of systems (except as listed in HPI above).  Objective:   Physical Exam  BP 112/63 (BP Location: Left Arm, Patient Position: Sitting, Cuff Size: Small)   Pulse 62   Temp (!) 97.5 F (36.4 C) (Temporal)   Resp 17   Ht _0  (1.575 m)   Wt 127 lb 3.2 oz (57.7 kg)   LMP 02/01/2021   SpO2 99%   BMI 23.27 kg/m   Wt Readings from Last 3 Encounters:  12/26/19 131 lb (59.4 kg)  10/31/19 143 lb (64.9 kg)  10/28/19 140 lb (63.5 kg)    General: Appears her stated age, well developed, well nourished in NAD. Skin: Warm, dry and intact.  HEENT: Head: normal shape and size; Eyes: sclera white and EOMs intact;  Neck:  Neck supple, trachea midline. No masses, lumps or thyromegaly present.  Cardiovascular: Normal rate and rhythm. S1,S2 noted.  No murmur, rubs or gallops noted. No JVD or BLE edema. No carotid bruits noted. Pulmonary/Chest: Normal effort and positive vesicular breath sounds. No respiratory distress. No wheezes, rales or ronchi noted.  Abdomen: Soft and nontender. Normal bowel sounds. No distention or masses noted. Liver, spleen and kidneys non palpable. Pelvic: Normal female anatomy. Cervix without mass or lesion. No discharge noted. No CMT. Adnexa non palpable. Musculoskeletal: Strength 5/5 BUE/BLE. No difficulty with gait.   Neurological: Alert and oriented. Cranial nerves II-XII grossly intact. Coordination normal.  Psychiatric: Mood and affect normal. Behavior is normal. Judgment and thought content normal.    BMET    Component Value Date/Time   NA 132 (L) 12/26/2019 0920   NA 140 02/11/2017 1538   K 3.7 12/26/2019 0920   CL 94 (L) 12/26/2019 0920   CO2 25 12/26/2019 0920   GLUCOSE 111 (H) 12/26/2019 0920   BUN 8 12/26/2019 0920   BUN 11 02/11/2017 1538   CREATININE 0.66 12/26/2019 0920   CALCIUM 8.9 12/26/2019 0920   GFRNONAA >60 12/26/2019 0920   GFRAA >60 12/26/2019 0920    Lipid Panel     Component Value Date/Time   CHOL 173 10/31/2019 0929   CHOL 156 02/11/2017 1538   TRIG 76.0 10/31/2019 0929   HDL 67.40 10/31/2019  0929   HDL 63 02/11/2017 1538   CHOLHDL 3 10/31/2019 0929   VLDL 15.2 10/31/2019 0929   LDLCALC 90 10/31/2019 0929   LDLCALC 79 02/11/2017 1538    CBC    Component Value Date/Time   WBC 9.8 12/26/2019 0920   RBC 5.51 (H) 12/26/2019 0920   HGB 15.5 (H) 12/26/2019 0920   HGB 13.9 02/11/2017 1538   HCT 43.9 12/26/2019 0920   HCT 40.5 02/11/2017 1538   PLT 330 12/26/2019 0920   PLT 246 02/11/2017 1538   MCV 79.7 (L) 12/26/2019 0920   MCV 84 02/11/2017 1538   MCH 28.1 12/26/2019 0920   MCHC 35.3 12/26/2019 0920   RDW 15.6 (H) 12/26/2019 0920   RDW 14.2 02/11/2017 1538   LYMPHSABS 0.7 12/26/2019 0920   MONOABS 0.4 12/26/2019 0920   EOSABS 0.0 12/26/2019 0920   BASOSABS 0.0 12/26/2019 0920    Hgb A1C No results found for: HGBA1C         Assessment & Plan:   Preventative Health Maintenance, Screening for Cervical Cancer:  Encouraged her to get a flu shot in the fall Tetanus UTD Encouraged her to get a COVID-vaccine Pap smear today, she declines STD screening Mammogram ordered, she will call to schedule Cologuard ordered Encouraged her to consume about statin exercise regimen Advised her to see an eye doctor and dentist annually We will check CBC,  c-Met, lipid, fsh, lh and hep C today  RTC in 1 year, sooner as needed Dawn Silversmith, NP This visit occurred during the SARS-CoV-2 public health emergency.  Safety protocols were in place, including screening questions prior to the visit, additional usage of staff PPE, and extensive cleaning of exam room while observing appropriate contact time as indicated for disinfecting solutions.

## 2021-02-06 NOTE — Patient Instructions (Signed)
Health Maintenance, Female Adopting a healthy lifestyle and getting preventive care are important in promoting health and wellness. Ask your health care provider about: The right schedule for you to have regular tests and exams. Things you can do on your own to prevent diseases and keep yourself healthy. What should I know about diet, weight, and exercise? Eat a healthy diet  Eat a diet that includes plenty of vegetables, fruits, low-fat dairy products, and lean protein. Do not eat a lot of foods that are high in solid fats, added sugars, or sodium. Maintain a healthy weight Body mass index (BMI) is used to identify weight problems. It estimates body fat based on height and weight. Your health care provider can help determine your BMI and help you achieve or maintain a healthy weight. Get regular exercise Get regular exercise. This is one of the most important things you can do for your health. Most adults should: Exercise for at least 150 minutes each week. The exercise should increase your heart rate and make you sweat (moderate-intensity exercise). Do strengthening exercises at least twice a week. This is in addition to the moderate-intensity exercise. Spend less time sitting. Even light physical activity can be beneficial. Watch cholesterol and blood lipids Have your blood tested for lipids and cholesterol at 47 years of age, then have this test every 5 years. Have your cholesterol levels checked more often if: Your lipid or cholesterol levels are high. You are older than 47 years of age. You are at high risk for heart disease. What should I know about cancer screening? Depending on your health history and family history, you may need to have cancer screening at various ages. This may include screening for: Breast cancer. Cervical cancer. Colorectal cancer. Skin cancer. Lung cancer. What should I know about heart disease, diabetes, and high blood pressure? Blood pressure and heart  disease High blood pressure causes heart disease and increases the risk of stroke. This is more likely to develop in people who have high blood pressure readings, are of African descent, or are overweight. Have your blood pressure checked: Every 3-5 years if you are 18-39 years of age. Every year if you are 40 years old or older. Diabetes Have regular diabetes screenings. This checks your fasting blood sugar level. Have the screening done: Once every three years after age 40 if you are at a normal weight and have a low risk for diabetes. More often and at a younger age if you are overweight or have a high risk for diabetes. What should I know about preventing infection? Hepatitis B If you have a higher risk for hepatitis B, you should be screened for this virus. Talk with your health care provider to find out if you are at risk for hepatitis B infection. Hepatitis C Testing is recommended for: Everyone born from 1945 through 1965. Anyone with known risk factors for hepatitis C. Sexually transmitted infections (STIs) Get screened for STIs, including gonorrhea and chlamydia, if: You are sexually active and are younger than 47 years of age. You are older than 47 years of age and your health care provider tells you that you are at risk for this type of infection. Your sexual activity has changed since you were last screened, and you are at increased risk for chlamydia or gonorrhea. Ask your health care provider if you are at risk. Ask your health care provider about whether you are at high risk for HIV. Your health care provider may recommend a prescription medicine   to help prevent HIV infection. If you choose to take medicine to prevent HIV, you should first get tested for HIV. You should then be tested every 3 months for as long as you are taking the medicine. Pregnancy If you are about to stop having your period (premenopausal) and you may become pregnant, seek counseling before you get  pregnant. Take 400 to 800 micrograms (mcg) of folic acid every day if you become pregnant. Ask for birth control (contraception) if you want to prevent pregnancy. Osteoporosis and menopause Osteoporosis is a disease in which the bones lose minerals and strength with aging. This can result in bone fractures. If you are 65 years old or older, or if you are at risk for osteoporosis and fractures, ask your health care provider if you should: Be screened for bone loss. Take a calcium or vitamin D supplement to lower your risk of fractures. Be given hormone replacement therapy (HRT) to treat symptoms of menopause. Follow these instructions at home: Lifestyle Do not use any products that contain nicotine or tobacco, such as cigarettes, e-cigarettes, and chewing tobacco. If you need help quitting, ask your health care provider. Do not use street drugs. Do not share needles. Ask your health care provider for help if you need support or information about quitting drugs. Alcohol use Do not drink alcohol if: Your health care provider tells you not to drink. You are pregnant, may be pregnant, or are planning to become pregnant. If you drink alcohol: Limit how much you use to 0-1 drink a day. Limit intake if you are breastfeeding. Be aware of how much alcohol is in your drink. In the [place] one drink equals one 12 oz bottle of beer (355 mL), one 5 oz glass of wine (148 mL), or one 1 oz glass of hard liquor (44 mL). General instructions Schedule regular health, dental, and eye exams. Stay current with your vaccines. Tell your health care provider if: You often feel depressed. You have ever been abused or do not feel safe at home. Summary Adopting a healthy lifestyle and getting preventive care are important in promoting health and wellness. Follow your health care provider's instructions about healthy diet, exercising, and getting tested or screened for diseases. Follow your health care provider's  instructions on monitoring your cholesterol and blood pressure. This information is not intended to replace advice given to you by your health care provider. Make sure you discuss any questions you have with your health care provider. Document Revised: 08/03/2020 Document Reviewed: 05/19/2018 Elsevier Patient Education  2022 Elsevier Inc.  

## 2021-02-07 DIAGNOSIS — G4700 Insomnia, unspecified: Secondary | ICD-10-CM | POA: Insufficient documentation

## 2021-02-07 DIAGNOSIS — G43829 Menstrual migraine, not intractable, without status migrainosus: Secondary | ICD-10-CM | POA: Insufficient documentation

## 2021-02-07 LAB — COMPLETE METABOLIC PANEL WITH GFR
AG Ratio: 2 (calc) (ref 1.0–2.5)
ALT: 15 U/L (ref 6–29)
AST: 14 U/L (ref 10–35)
Albumin: 4.5 g/dL (ref 3.6–5.1)
Alkaline phosphatase (APISO): 38 U/L (ref 31–125)
BUN: 13 mg/dL (ref 7–25)
CO2: 27 mmol/L (ref 20–32)
Calcium: 9.4 mg/dL (ref 8.6–10.2)
Chloride: 104 mmol/L (ref 98–110)
Creat: 0.63 mg/dL (ref 0.50–0.99)
Globulin: 2.3 g/dL (calc) (ref 1.9–3.7)
Glucose, Bld: 90 mg/dL (ref 65–99)
Potassium: 4.3 mmol/L (ref 3.5–5.3)
Sodium: 138 mmol/L (ref 135–146)
Total Bilirubin: 0.3 mg/dL (ref 0.2–1.2)
Total Protein: 6.8 g/dL (ref 6.1–8.1)
eGFR: 111 mL/min/{1.73_m2} (ref >=60)

## 2021-02-07 LAB — CBC
HCT: 47.1 % — ABNORMAL HIGH (ref 35.0–45.0)
Hemoglobin: 15.2 g/dL (ref 11.7–15.5)
MCH: 28.6 pg (ref 27.0–33.0)
MCHC: 32.3 g/dL (ref 32.0–36.0)
MCV: 88.5 fL (ref 80.0–100.0)
MPV: 10.3 fL (ref 7.5–12.5)
Platelets: 282 10*3/uL (ref 140–400)
RBC: 5.32 10*6/uL — ABNORMAL HIGH (ref 3.80–5.10)
RDW: 13.1 % (ref 11.0–15.0)
WBC: 7.2 10*3/uL (ref 3.8–10.8)

## 2021-02-07 LAB — CYTOLOGY - PAP: Diagnosis: NEGATIVE

## 2021-02-07 LAB — LIPID PANEL
Cholesterol: 172 mg/dL (ref <200)
HDL: 60 mg/dL (ref >=50)
LDL Cholesterol (Calc): 98 mg/dL (calc)
Non-HDL Cholesterol (Calc): 112 mg/dL (calc) (ref <130)
Total CHOL/HDL Ratio: 2.9 (calc) (ref <5.0)
Triglycerides: 62 mg/dL (ref <150)

## 2021-02-07 LAB — HEPATITIS C ANTIBODY
Hepatitis C Ab: NONREACTIVE
SIGNAL TO CUT-OFF: 0.01 (ref <1.00)

## 2021-02-07 LAB — FSH/LH
FSH: 7.3 m[IU]/mL
LH: 5.2 m[IU]/mL

## 2021-02-07 MED ORDER — CYCLOBENZAPRINE HCL 5 MG PO TABS: ORAL_TABLET | ORAL | 0 refills | Status: DC

## 2021-02-07 MED ORDER — TRAMADOL HCL 50 MG PO TABS: ORAL_TABLET | ORAL | 0 refills | Status: DC

## 2021-02-07 MED ORDER — ALPRAZOLAM 0.5 MG PO TABS: 0.5000 mg | ORAL_TABLET | Freq: Every evening | ORAL | 0 refills | Status: AC | PRN

## 2021-02-07 NOTE — Assessment & Plan Note (Signed)
Continue Xanax as needed Support offered 

## 2021-02-07 NOTE — Assessment & Plan Note (Signed)
Encouraged regular stretching Continue Flexeril and Tramadol, refilled today

## 2021-02-07 NOTE — Assessment & Plan Note (Signed)
Continue Tylenol as needed Has Mirena FSH and LH today

## 2021-02-07 NOTE — Assessment & Plan Note (Signed)
Currently not an issue off meds Will monitor 

## 2021-02-20 LAB — COLOGUARD

## 2021-07-17 ENCOUNTER — Ambulatory Visit: Payer: Self-pay | Admitting: *Deleted

## 2021-07-17 NOTE — Telephone Encounter (Signed)
°  Chief Complaint: stomach discomfort Symptoms: lack of appetite, stomach discomfort- nausea when eats, back pain with BM, weight loss Frequency: chronic Pertinent Negatives: Patient denies  diarrhea, fever, urination pain Disposition: [] ED /[] Urgent Care (no appt availability in office) / [x] Appointment(In office/virtual)/ []  [facility]/ [] Home Care/ [] Refused Recommended Disposition /[] [facility]/ []  Follow-up with PCP Additional Notes:

## 2021-07-17 NOTE — Telephone Encounter (Signed)
Reason for Disposition  Abdominal pain is a chronic symptom (recurrent or ongoing AND present > 4 weeks)  Answer Assessment - Initial Assessment Questions 1. LOCATION: "Where does it hurt?"      No appetite - no pain 2. RADIATION: "Does the pain shoot anywhere else?" (e.g., chest, back)     *No Answer* 3. ONSET: "When did the pain begin?" (e.g., minutes, hours or days ago)      August- chronic problem 4. SUDDEN: "Gradual or sudden onset?"     *No Answer* 5. PATTERN "Does the pain come and go, or is it constant?"    - If constant: "Is it getting better, staying the same, or worsening?"      (Note: Constant means the pain never goes away completely; most serious pain is constant and it progresses)     - If intermittent: "How long does it last?" "Do you have pain now?"     (Note: Intermittent means the pain goes away completely between bouts)     patient feels nausea when eating 6. SEVERITY: "How bad is the pain?"  (e.g., Scale 1-10; mild, moderate, or severe)   - MILD (1-3): doesn't interfere with normal activities, abdomen soft and not tender to touch    - MODERATE (4-7): interferes with normal activities or awakens from sleep, abdomen tender to touch    - SEVERE (8-10): excruciating pain, doubled over, unable to do any normal activities      *No Answer* 7. RECURRENT SYMPTOM: "Have you ever had this type of stomach pain before?" If Yes, ask: "When was the last time?" and "What happened that time?"      *No Answer* 8. CAUSE: "What do you think is causing the stomach pain?"     Unsure- stopping prilosec makes it worse 9. RELIEVING/AGGRAVATING FACTORS: "What makes it better or worse?" (e.g., movement, antacids, bowel movement)     *No Answer* 10. OTHER SYMPTOMS: "Do you have any other symptoms?" (e.g., back pain, diarrhea, fever, urination pain, vomiting)       Lower back pain before BM 11. PREGNANCY: "Is there any chance you are pregnant?" "When was your last menstrual period?"       *No  Answer*  Protocols used: Abdominal Pain - [facility] [place]

## 2021-07-22 ENCOUNTER — Ambulatory Visit (INDEPENDENT_AMBULATORY_CARE_PROVIDER_SITE_OTHER): Payer: No Typology Code available for payment source | Admitting: Internal Medicine

## 2021-07-22 ENCOUNTER — Encounter: Payer: Self-pay | Admitting: Internal Medicine

## 2021-07-22 ENCOUNTER — Other Ambulatory Visit: Payer: Self-pay

## 2021-07-22 ENCOUNTER — Ambulatory Visit
Admission: RE | Admit: 2021-07-22 | Discharge: 2021-07-22 | Disposition: A | Discharge status: Home / Self Care | Payer: No Typology Code available for payment source | Source: Ambulatory Visit | Attending: Internal Medicine | Admitting: Internal Medicine

## 2021-07-22 VITALS — BP 122/78 | HR 68 | Temp 97.7°F | Wt 121.0 lb

## 2021-07-22 DIAGNOSIS — R110 Nausea: Secondary | ICD-10-CM

## 2021-07-22 DIAGNOSIS — R1084 Generalized abdominal pain: Secondary | ICD-10-CM | POA: Insufficient documentation

## 2021-07-22 DIAGNOSIS — R634 Abnormal weight loss: Secondary | ICD-10-CM

## 2021-07-22 DIAGNOSIS — M545 Low back pain, unspecified: Secondary | ICD-10-CM | POA: Diagnosis present

## 2021-07-22 DIAGNOSIS — R630 Anorexia: Secondary | ICD-10-CM

## 2021-07-22 DIAGNOSIS — R195 Other fecal abnormalities: Secondary | ICD-10-CM

## 2021-07-22 LAB — POCT URINALYSIS DIPSTICK
Bilirubin, UA: NEGATIVE
Blood, UA: NEGATIVE
Glucose, UA: NEGATIVE
Leukocytes, UA: NEGATIVE
Nitrite, UA: NEGATIVE
Protein, UA: POSITIVE — AB
Spec Grav, UA: 1.025 (ref 1.010–1.025)
Urobilinogen, UA: 0.2 E.U./dL
pH, UA: 5 (ref 5.0–8.0)

## 2021-07-22 MED ORDER — IOHEXOL 300 MG/ML  SOLN
80.0000 mL | Freq: Once | INTRAMUSCULAR | Status: AC | PRN
  Administered 2021-07-22: 80 mL via INTRAVENOUS

## 2021-07-22 MED ORDER — TRAMADOL HCL 50 MG PO TABS: ORAL_TABLET | ORAL | 0 refills | Status: AC

## 2021-07-22 MED ORDER — ONDANSETRON HCL 4 MG PO TABS: 4.0000 mg | ORAL_TABLET | Freq: Every day | ORAL | 0 refills | Status: AC | PRN

## 2021-07-22 NOTE — Patient Instructions (Signed)
Abdominal Pain, Adult Many things can cause belly (abdominal) pain. Most times, belly pain is not dangerous. Many cases of belly pain can be watched and treated at home. Sometimes, though, belly pain is serious. Yourdoctor will try to find the cause of your belly pain. Follow these instructions at home:  Medicines Take over-the-counter and prescription medicines only as told by your doctor. Do not take medicines that help you poop (laxatives) unless told by your doctor. General instructions Watch your belly pain for any changes. Drink enough fluid to keep your pee (urine) pale yellow. Keep all follow-up visits as told by your doctor. This is important. Contact a doctor if: Your belly pain changes or gets worse. You are not hungry, or you lose weight without trying. You are having trouble pooping (constipated) or have watery poop (diarrhea) for more than 2-3 days. You have pain when you pee or poop. Your belly pain wakes you up at night. Your pain gets worse with meals, after eating, or with certain foods. You are vomiting and cannot keep anything down. You have a fever. You have blood in your pee. Get help right away if: Your pain does not go away as soon as your doctor says it should. You cannot stop vomiting. Your pain is only in areas of your belly, such as the right side or the left lower part of the belly. You have bloody or black poop, or poop that looks like tar. You have very bad pain, cramping, or bloating in your belly. You have signs of not having enough fluid or water in your body (dehydration), such as: Dark pee, very little pee, or no pee. Cracked lips. Dry mouth. Sunken eyes. Sleepiness. Weakness. You have trouble breathing or chest pain. Summary Many cases of belly pain can be watched and treated at home. Watch your belly pain for any changes. Take over-the-counter and prescription medicines only as told by your doctor. Contact a doctor if your belly pain  changes or gets worse. Get help right away if you have very bad pain, cramping, or bloating in your belly. This information is not intended to replace advice given to you by your health care provider. Make sure you discuss any questions you have with your healthcare provider. Document Revised: 10/04/2018 Document Reviewed: 10/04/2018 Elsevier Patient Education  2022 Elsevier Inc.  

## 2021-07-22 NOTE — Progress Notes (Signed)
Subjective:    Patient ID: Dawn Conrad, female    DOB: 02/20/1976, 46 y.o.   MRN: [id number]  HPI  Patient presents to clinic today with complaint of decreased appetite, nausea, loose stools, abdominal pain and low back pain.  She reports this started 6 days ago.  She describes the back pain as throbbing.  The pain radiates up to her mid back and around to her bilateral abdomen.  She describes the abdominal pain is aching, crampy.  She reports associated poor appetite, nausea, loose stools and unintentional weight loss.  She denies vomiting, reflux or blood in her stool.  She denies urinary or vaginal complaints.  She denies fever, chills or body aches.  She has not had any changes in diet or medications.  She has tried Prilosec OTC with minimal relief of symptoms.  She has no history of stomach, liver, pancreatic, ovarian, uterine or bladder cancer.  She does not smoke.  She has tried Ultram OTC with minimal relief of symptoms.  Review of Systems     Past Medical History:  Diagnosis Date   Allergy    Anxiety    Frequent headaches    History of chicken pox     Current Outpatient Medications  Medication Sig Dispense Refill   ALPRAZolam (XANAX) 0.5 MG tablet Take 1 tablet (0.5 mg total) by mouth at bedtime as needed. TAKE 1/2 TO 1 TABLET ONCE A DAY 20 tablet 0   cyclobenzaprine (FLEXERIL) 5 MG tablet TAKE ONE TABLET EVERY DAY AS NEEDED FOR MUSCLE SPASM 30 tablet 0   EPINEPHrine (EPIPEN 2-PAK) 0.3 mg/0.3 mL IJ SOAJ injection Inject 0.3 mLs (0.3 mg total) into the muscle as needed for anaphylaxis. (Patient not taking: Reported on 02/06/2021) 1 each 1   levonorgestrel (MIRENA) 20 MCG/24HR IUD 1 each by Intrauterine route once.     ondansetron (ZOFRAN) 4 MG tablet Take 1 tablet (4 mg total) by mouth daily as needed for nausea or vomiting. (Patient not taking: Reported on 02/06/2021) 20 tablet 0   traMADol (ULTRAM) 50 MG tablet TAKE ONE TABLET EVERY DAY AS NEEDED 20 tablet 0   No current  facility-administered medications for this visit.    Allergies  Allergen Reactions   Other Other (See Comments)    Patient states she is allergic to Hay/Straw.  This was the cause of her ER visit. Throat swelling.   Codeine Other (See Comments)    "shakiness" per pt    Family History  Problem Relation Age of Onset   Arthritis Mother    Arthritis Father    Heart disease Father    Arthritis Maternal Grandmother    Arthritis Paternal Grandmother     Social History   Socioeconomic History   Marital status: Married    Spouse name: Not on file   Number of children: Not on file   Years of education: Not on file   Highest education level: Not on file  Occupational History   Not on file  Tobacco Use   Smoking status: Never   Smokeless tobacco: Never  Vaping Use   Vaping Use: Never used  Substance and Sexual Activity   Alcohol use: Yes    Comment: occasional   Drug use: No   Sexual activity: Yes    Comment: mirena  Other Topics Concern   Not on file  Social History Narrative   Not on file   Social Determinants of Health   Financial Resource Strain: Not on file  Food  Insecurity: Not on file  Transportation Needs: Not on file  Physical Activity: Not on file  Stress: Not on file  Social Connections: Not on file  Intimate Partner Violence: Not on file     Constitutional: Denies fever, malaise, fatigue, headache or abrupt weight changes.  Respiratory: Denies difficulty breathing, shortness of breath, cough or sputum production.   Cardiovascular: Denies chest pain, chest tightness, palpitations or swelling in the hands or feet.  Gastrointestinal: Patient reports poor appetite, nausea, loose stools and abdominal pain.  Denies bloating, constipation, or blood in the stool.  GU: Denies urgency, frequency, pain with urination, burning sensation, blood in urine, odor or discharge. Musculoskeletal: Patient reports low back pain.  Denies decrease in range of motion, difficulty  with gait, or joint swelling.  Skin: Denies redness, rashes, lesions or ulcercations.  Neurological: Denies numbness, tingling, weakness of the lower extremities or problems with balance and coordination.    No other specific complaints in a complete review of systems (except as listed in HPI above).  Objective:   Physical Exam BP 122/78 (BP Location: Left Arm, Patient Position: Sitting, Cuff Size: Normal)    Pulse 68    Temp 97.7 F (36.5 C) (Temporal)    Wt 121 lb (54.9 kg)    SpO2 100%    BMI 22.13 kg/m    Wt Readings from Last 3 Encounters:  02/06/21 127 lb 3.2 oz (57.7 kg)  12/26/19 131 lb (59.4 kg)  10/31/19 143 lb (64.9 kg)    General: Appears her stated age, well developed, well nourished in NAD. Skin: Warm, dry and intact. No rashes noted. Neck:  Neck supple, trachea midline. No masses, lumps or thyromegaly present.  Cardiovascular: Normal rate and rhythm. S1,S2 noted.  No murmur, rubs or gallops noted.  Pulmonary/Chest: Normal effort and positive vesicular breath sounds. No respiratory distress. No wheezes, rales or ronchi noted.  Abdomen: Soft and generally tender.  Hyperactive bowel sounds. No distention or masses noted. Liver, spleen and kidneys non palpable.  No CVA tenderness noted. Musculoskeletal: Bony tenderness noted over the lumbar spine.  Pain with palpation of the left paralumbar muscles.  No difficulty with gait.  Neurological: Alert and oriented.   BMET    Component Value Date/Time   NA 138 02/06/2021 0924   NA 140 02/11/2017 1538   K 4.3 02/06/2021 0924   CL 104 02/06/2021 0924   CO2 27 02/06/2021 0924   GLUCOSE 90 02/06/2021 0924   BUN 13 02/06/2021 0924   BUN 11 02/11/2017 1538   CREATININE 0.63 02/06/2021 0924   CALCIUM 9.4 02/06/2021 0924   GFRNONAA >60 12/26/2019 0920   GFRAA >60 12/26/2019 0920    Lipid Panel     Component Value Date/Time   CHOL 172 02/06/2021 0924   CHOL 156 02/11/2017 1538   TRIG 62 02/06/2021 0924   HDL 60  02/06/2021 0924   HDL 63 02/11/2017 1538   CHOLHDL 2.9 02/06/2021 0924   VLDL 15.2 10/31/2019 0929   LDLCALC 98 02/06/2021 0924    CBC    Component Value Date/Time   WBC 7.2 02/06/2021 0924   RBC 5.32 (H) 02/06/2021 0924   HGB 15.2 02/06/2021 0924   HGB 13.9 02/11/2017 1538   HCT 47.1 (H) 02/06/2021 0924   HCT 40.5 02/11/2017 1538   PLT 282 02/06/2021 0924   PLT 246 02/11/2017 1538   MCV 88.5 02/06/2021 0924   MCV 84 02/11/2017 1538   MCH 28.6 02/06/2021 0924   MCHC 32.3  02/06/2021 0924   RDW 13.1 02/06/2021 0924   RDW 14.2 02/11/2017 1538   LYMPHSABS 0.7 12/26/2019 0920   MONOABS 0.4 12/26/2019 0920   EOSABS 0.0 12/26/2019 0920   BASOSABS 0.0 12/26/2019 0920    Hgb A1C No results found for: HGBA1C         Assessment & Plan:   Acute Low Back Pain, Generalized Abdominal Pain, Nausea, Loose Stools, Decreased Appetite and Unintentional Weight Loss:  We will check c-Met, amylase, lipase, H. pylori Urinalysis: trace protein, trace ketones No indication for urine culture STAT CT abdomen/pelvis RX for Zofran 4 mg ODT Q8H prn  Webb Silversmith, NP This visit occurred during the SARS-CoV-2 public health emergency.  Safety protocols were in place, including screening questions prior to the visit, additional usage of staff PPE, and extensive cleaning of exam room while observing appropriate contact time as indicated for disinfecting solutions.

## 2021-07-22 NOTE — Addendum Note (Signed)
Addended by: Ashley Royalty E on: 07/22/2021 10:48 AM   Modules accepted: Orders

## 2021-07-23 LAB — CBC
HCT: 45.3 % — ABNORMAL HIGH (ref 35.0–45.0)
Hemoglobin: 15 g/dL (ref 11.7–15.5)
MCH: 28.7 pg (ref 27.0–33.0)
MCHC: 33.1 g/dL (ref 32.0–36.0)
MCV: 86.6 fL (ref 80.0–100.0)
MPV: 10.4 fL (ref 7.5–12.5)
Platelets: 298 10*3/uL (ref 140–400)
RBC: 5.23 10*6/uL — ABNORMAL HIGH (ref 3.80–5.10)
RDW: 13.1 % (ref 11.0–15.0)
WBC: 7.9 10*3/uL (ref 3.8–10.8)

## 2021-07-23 LAB — COMPLETE METABOLIC PANEL WITH GFR
AG Ratio: 2 (calc) (ref 1.0–2.5)
ALT: 21 U/L (ref 6–29)
AST: 20 U/L (ref 10–35)
Albumin: 4.7 g/dL (ref 3.6–5.1)
Alkaline phosphatase (APISO): 43 U/L (ref 31–125)
BUN: 15 mg/dL (ref 7–25)
CO2: 28 mmol/L (ref 20–32)
Calcium: 9.7 mg/dL (ref 8.6–10.2)
Chloride: 100 mmol/L (ref 98–110)
Creat: 0.78 mg/dL (ref 0.50–0.99)
Globulin: 2.4 g/dL (calc) (ref 1.9–3.7)
Glucose, Bld: 95 mg/dL (ref 65–99)
Potassium: 4.4 mmol/L (ref 3.5–5.3)
Sodium: 136 mmol/L (ref 135–146)
Total Bilirubin: 0.6 mg/dL (ref 0.2–1.2)
Total Protein: 7.1 g/dL (ref 6.1–8.1)
eGFR: 94 mL/min/{1.73_m2} (ref >=60)

## 2021-07-23 LAB — AMYLASE: Amylase: 60 U/L (ref 21–101)

## 2021-07-23 LAB — H. PYLORI BREATH TEST: H. pylori Breath Test: NOT DETECTED

## 2021-07-23 LAB — LIPASE: Lipase: 42 U/L (ref 7–60)

## 2021-07-24 NOTE — Addendum Note (Signed)
Addended by: Jearld Fenton on: 07/24/2021 07:05 AM   Modules accepted: Orders

## 2021-08-01 ENCOUNTER — Other Ambulatory Visit: Payer: Self-pay

## 2021-08-01 ENCOUNTER — Encounter: Payer: Self-pay | Admitting: Gastroenterology

## 2021-08-01 ENCOUNTER — Ambulatory Visit (INDEPENDENT_AMBULATORY_CARE_PROVIDER_SITE_OTHER): Payer: No Typology Code available for payment source | Admitting: Gastroenterology

## 2021-08-01 VITALS — BP 151/74 | HR 76 | Temp 97.9°F | Ht 62.0 in | Wt 120.1 lb

## 2021-08-01 DIAGNOSIS — R195 Other fecal abnormalities: Secondary | ICD-10-CM | POA: Diagnosis not present

## 2021-08-01 DIAGNOSIS — R634 Abnormal weight loss: Secondary | ICD-10-CM

## 2021-08-01 DIAGNOSIS — J309 Allergic rhinitis, unspecified: Secondary | ICD-10-CM | POA: Insufficient documentation

## 2021-08-01 DIAGNOSIS — R110 Nausea: Secondary | ICD-10-CM

## 2021-08-01 MED ORDER — NA SULFATE-K SULFATE-MG SULF 17.5-3.13-1.6 GM/177ML PO SOLN: 354.0000 mL | Freq: Once | ORAL | 0 refills | Status: AC

## 2021-08-01 NOTE — Progress Notes (Signed)
Cephas Darby, MD [address]  [place]  [place],  [address]  Main: [phone]  Fax: [phone]    Gastroenterology Consultation  Referring Provider:     Jearld Fenton, NP Primary Care Physician:  Jearld Fenton, NP Primary Gastroenterologist:  Dr. Cephas Darby Reason for Consultation:     Nausea, unintentional weight loss        HPI:   Dawn Conrad is a 46 y.o. female referred by Dr. Jearld Fenton, NP  for consultation & management of chronic nausea and weight loss for the last 2 months.  Patient reports that she has constant nausea, worse after eating, early satiety, Zofran helps with nausea unable to eat small portions only.  Patient lost about 7 pounds within last 2 months.  She is tearful about ongoing symptoms and finding no answers yet.  H. pylori test was negative, no evidence of anemia or leukocytosis, TSH normal.  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  Her symptoms are associated with low back pain which is also constant.  She does report intermittent loose stools without any bleeding.  She denies any abdominal cramps or bloating.  Patient had COVID-19 infection in July 2021, found to have multifocal pneumonia.  Patient reports that since then, she never felt normal, was experiencing mild GI symptoms until recently.  Patient denies any particular relation to food.  No known food intolerances or allergies.  Patient has history of anxiety, denies any stressors in her life or major significant events in her life within last 6 months to 1 year  Patient does not smoke or drink alcohol She is a real state agent She is accompanied by her husband today who seem to be very supportive  NSAIDs: None  Antiplts/Anticoagulants/Anti thrombotics: None  GI Procedures: None  Past Medical History:  Diagnosis Date   Allergy    Anxiety    Frequent headaches    History of chicken pox     Past  Surgical History:  Procedure Laterality Date   KNEE SURGERY Right    bone spur removal    Current Outpatient Medications:    ALPRAZolam (XANAX) 0.5 MG tablet, Take 1 tablet (0.5 mg total) by mouth at bedtime as needed. TAKE 1/2 TO 1 TABLET ONCE A DAY, Disp: 20 tablet, Rfl: 0   levonorgestrel (MIRENA) 20 MCG/24HR IUD, 1 each by Intrauterine route once., Disp: , Rfl:    Na Sulfate-K Sulfate-Mg Sulf 17.5-3.13-1.6 GM/177ML SOLN, Take 354 mLs by mouth once for 1 dose., Disp: 354 mL, Rfl: 0   ondansetron (ZOFRAN) 4 MG tablet, Take 1 tablet (4 mg total) by mouth daily as needed for nausea or vomiting., Disp: 20 tablet, Rfl: 0   traMADol (ULTRAM) 50 MG tablet, TAKE ONE TABLET EVERY DAY AS NEEDED, Disp: 20 tablet, Rfl: 0    Family History  Problem Relation Age of Onset   Arthritis Mother    Arthritis Father    Heart disease Father    Arthritis Maternal Grandmother    Arthritis Paternal Grandmother      Social History   Tobacco Use   Smoking status: Never   Smokeless tobacco: Never  Vaping Use   Vaping Use: Never used  Substance Use Topics   Alcohol use: Yes    Comment: occasional   Drug use: No    Allergies as of 08/01/2021 - Review Complete 08/01/2021  Allergen Reaction Noted  Other Other (See Comments) 11/01/2019   Codeine Other (See Comments) 07/23/2015    Review of Systems:    All systems reviewed and negative except where noted in HPI.   Physical Exam:  BP (!) 151/74 (BP Location: Left Arm, Patient Position: Sitting, Cuff Size: Normal)    Pulse 76    Temp 97.9 F (36.6 C) (Oral)    Ht 5\' 2"  (1.575 m)    Wt 120 lb 2 oz (54.5 kg)    BMI 21.97 kg/m  No LMP recorded. (Menstrual status: IUD).  General:   Alert, thin built, well-nourished, pleasant and cooperative in NAD Head:  Normocephalic and atraumatic. Eyes:  Sclera clear, no icterus.   Conjunctiva pink. Ears:  Normal auditory acuity. Nose:  No deformity, discharge, or lesions. Mouth:  No deformity or  lesions,oropharynx pink & moist. Neck:  Supple; no masses or thyromegaly. Lungs:  Respirations even and unlabored.  Clear throughout to auscultation.   No wheezes, crackles, or rhonchi. No acute distress. Heart:  Regular rate and rhythm; no murmurs, clicks, rubs, or gallops. Abdomen:  Normal bowel sounds. Soft, non-tender and non-distended without masses, hepatosplenomegaly or hernias noted.  No guarding or rebound tenderness.   Rectal: Not performed Msk:  Symmetrical without gross deformities. Good, equal movement & strength bilaterally. Pulses:  Normal pulses noted. Extremities:  No clubbing or edema.  No cyanosis. Neurologic:  Alert and oriented x3;  grossly normal neurologically. Skin:  Intact without significant lesions or rashes. No jaundice. Psych:  Alert and cooperative. Normal mood and affect.  Imaging Studies: Reviewed  Assessment and Plan:   Dawn Conrad is a 46 y.o. Caucasian female with history of COVID-19 infection in 2021 is seen in consultation for 2 months history of nausea, early satiety, unintentional weight loss, loose stools  H. pylori test is negative, CBC, CMP, TSH unremarkable Recommend upper endoscopy and colonoscopy with TI evaluation and biopsies.  If this is negative, recommend gastric emptying study +/- HIDA scan Discussed about protein shakes/supplements, small frequent meals Continue Zofran as needed   Follow up in 2 months   Cephas Darby, MD

## 2021-08-08 ENCOUNTER — Telehealth: Payer: Self-pay

## 2021-08-08 ENCOUNTER — Encounter: Payer: Self-pay | Admitting: Gastroenterology

## 2021-08-08 MED ORDER — NA SULFATE-K SULFATE-MG SULF 17.5-3.13-1.6 GM/177ML PO SOLN: 354.0000 mL | Freq: Once | ORAL | 0 refills | Status: AC

## 2021-08-08 NOTE — Telephone Encounter (Signed)
Doreene Burke, RN  Shelby Mattocks, CMA ?Everlena Cooper,  ? ?I spoke with this patient this morning regarding pre-operative instructions for her upcoming procedures. She mentioned that she did not receive her prep yet and would like it to be sent to [facility]. I have changed her pharmacy in the system. Can you resend a rx to the newly assigned pharmacy? Thank you!  ? ?Sent medication to [facility]  ? ?

## 2021-08-20 ENCOUNTER — Encounter
Admission: RE | Disposition: A | Discharge status: Home / Self Care | Payer: Self-pay | Source: Home / Self Care | Attending: Gastroenterology

## 2021-08-20 ENCOUNTER — Ambulatory Visit: Payer: No Typology Code available for payment source | Admitting: Anesthesiology

## 2021-08-20 ENCOUNTER — Encounter: Payer: Self-pay | Admitting: Gastroenterology

## 2021-08-20 ENCOUNTER — Other Ambulatory Visit: Payer: Self-pay

## 2021-08-20 ENCOUNTER — Ambulatory Visit
Admission: RE | Admit: 2021-08-20 | Discharge: 2021-08-20 | Disposition: A | Discharge status: Home / Self Care | Payer: No Typology Code available for payment source | Attending: Gastroenterology | Admitting: Gastroenterology

## 2021-08-20 DIAGNOSIS — F419 Anxiety disorder, unspecified: Secondary | ICD-10-CM | POA: Insufficient documentation

## 2021-08-20 DIAGNOSIS — R195 Other fecal abnormalities: Secondary | ICD-10-CM

## 2021-08-20 DIAGNOSIS — K635 Polyp of colon: Secondary | ICD-10-CM | POA: Diagnosis not present

## 2021-08-20 DIAGNOSIS — R197 Diarrhea, unspecified: Secondary | ICD-10-CM | POA: Insufficient documentation

## 2021-08-20 DIAGNOSIS — R110 Nausea: Secondary | ICD-10-CM

## 2021-08-20 DIAGNOSIS — K2210 Ulcer of esophagus without bleeding: Secondary | ICD-10-CM | POA: Diagnosis not present

## 2021-08-20 DIAGNOSIS — D125 Benign neoplasm of sigmoid colon: Secondary | ICD-10-CM | POA: Diagnosis not present

## 2021-08-20 DIAGNOSIS — R634 Abnormal weight loss: Secondary | ICD-10-CM

## 2021-08-20 DIAGNOSIS — K2100 Gastro-esophageal reflux disease with esophagitis, without bleeding: Secondary | ICD-10-CM | POA: Diagnosis not present

## 2021-08-20 LAB — POCT PREGNANCY, URINE: Preg Test, Ur: NEGATIVE

## 2021-08-20 SURGERY — COLONOSCOPY WITH PROPOFOL
Anesthesia: General | Site: Rectum

## 2021-08-20 MED ORDER — OMEPRAZOLE 40 MG PO CPDR: 40.0000 mg | DELAYED_RELEASE_CAPSULE | Freq: Every day | ORAL | 2 refills | Status: AC

## 2021-08-20 MED ORDER — STERILE WATER FOR IRRIGATION IR SOLN
Status: DC | PRN
  Administered 2021-08-20: .05 mL

## 2021-08-20 MED ORDER — ACETAMINOPHEN 160 MG/5ML PO SOLN: 325.0000 mg | Freq: Once | ORAL | Status: DC

## 2021-08-20 MED ORDER — SODIUM CHLORIDE 0.9 % IV SOLN: INTRAVENOUS | Status: DC

## 2021-08-20 MED ORDER — ACETAMINOPHEN 325 MG PO TABS: 325.0000 mg | ORAL_TABLET | Freq: Once | ORAL | Status: DC

## 2021-08-20 MED ORDER — LIDOCAINE HCL (CARDIAC) PF 100 MG/5ML IV SOSY
PREFILLED_SYRINGE | INTRAVENOUS | Status: DC | PRN
  Administered 2021-08-20: 40 mg via INTRAVENOUS

## 2021-08-20 MED ORDER — PROPOFOL 10 MG/ML IV BOLUS
INTRAVENOUS | Status: DC | PRN
  Administered 2021-08-20 (×2): 80 mg via INTRAVENOUS
  Administered 2021-08-20: 20 mg via INTRAVENOUS
  Administered 2021-08-20: 80 mg via INTRAVENOUS
  Administered 2021-08-20: 50 mg via INTRAVENOUS
  Administered 2021-08-20: 30 mg via INTRAVENOUS
  Administered 2021-08-20: 80 mg via INTRAVENOUS

## 2021-08-20 MED ORDER — STERILE WATER FOR IRRIGATION IR SOLN
Status: DC | PRN
  Administered 2021-08-20: 250 mL

## 2021-08-20 MED ORDER — LACTATED RINGERS IV SOLN: INTRAVENOUS | Status: DC

## 2021-08-20 MED ORDER — GLYCOPYRROLATE 0.2 MG/ML IJ SOLN
INTRAMUSCULAR | Status: DC | PRN
Start: 2021-08-20 — End: 2021-08-20
  Administered 2021-08-20: .1 mg via INTRAVENOUS

## 2021-08-20 SURGICAL SUPPLY — 13 items
BLOCK BITE 60FR ADLT L/F GRN (MISCELLANEOUS) ×3 IMPLANT
ELECT REM PT RETURN 9FT ADLT (ELECTROSURGICAL) ×3
ELECTRODE REM PT RTRN 9FT ADLT (ELECTROSURGICAL) IMPLANT
FORCEPS BIOP RAD 4 LRG CAP 4 (CUTTING FORCEPS) ×2 IMPLANT
GOWN CVR UNV OPN BCK APRN NK (MISCELLANEOUS) ×4 IMPLANT
GOWN ISOL THUMB LOOP REG UNIV (MISCELLANEOUS) ×6
KIT PRC NS LF DISP ENDO (KITS) ×2 IMPLANT
KIT PROCEDURE OLYMPUS (KITS) ×3
MANIFOLD NEPTUNE II (INSTRUMENTS) ×3 IMPLANT
SNARE COLD EXACTO (MISCELLANEOUS) IMPLANT
SNARE LASSO HEX 3 IN 1 (INSTRUMENTS) ×1 IMPLANT
TRAP ETRAP POLY (MISCELLANEOUS) ×1 IMPLANT
WATER STERILE IRR 250ML POUR (IV SOLUTION) ×3 IMPLANT

## 2021-08-20 NOTE — Op Note (Signed)
[facility] ?Gastroenterology ?Patient Name: Dawn Conrad ?Procedure Date: 08/20/2021 9:02 AM ?MRN: [id number] ?Account #: [id number] ?Date of Birth: June 26, 1975 ?Admit Type: Outpatient ?Age: 46 ?Room: [facility] OR ROOM 01 ?Gender: Female ?Note Status: Finalized ?Instrument Name: [id number] ?Procedure:             Colonoscopy ?Indications:           Diarrhea, Weight loss ?Providers:             Lin Landsman MD, MD ?Referring MD:          Jearld Fenton (Referring MD) ?Medicines:             General Anesthesia ?Complications:         No immediate complications. Estimated blood loss: None. ?Procedure:             Pre-Anesthesia Assessment: ?                       - Prior to the procedure, a History and Physical was  ?                       performed, and patient medications and allergies were  ?                       reviewed. The patient is competent. The risks and  ?                       benefits of the procedure and the sedation options and  ?                       risks were discussed with the patient. All questions  ?                       were answered and informed consent was obtained.  ?                       Patient identification and proposed procedure were  ?                       verified by the physician, the nurse, the  ?                       anesthesiologist, the anesthetist and the technician  ?                       in the pre-procedure area in the procedure room in the  ?                       endoscopy suite. Mental Status Examination: alert and  ?                       oriented. Airway Examination: normal oropharyngeal  ?                       airway and neck mobility. Respiratory Examination:  ?                       clear to auscultation. CV Examination: normal.  ?  Prophylactic Antibiotics: The patient does not require  ?                       prophylactic antibiotics. Prior Anticoagulants: The  ?                       patient has taken no previous  anticoagulant or  ?                       antiplatelet agents. ASA Grade Assessment: II - A  ?                       patient with mild systemic disease. After reviewing  ?                       the risks and benefits, the patient was deemed in  ?                       satisfactory condition to undergo the procedure. The  ?                       anesthesia plan was to use general anesthesia.  ?                       Immediately prior to administration of medications,  ?                       the patient was re-assessed for adequacy to receive  ?                       sedatives. The heart rate, respiratory rate, oxygen  ?                       saturations, blood pressure, adequacy of pulmonary  ?                       ventilation, and response to care were monitored  ?                       throughout the procedure. The physical status of the  ?                       patient was re-assessed after the procedure. ?                       After obtaining informed consent, the colonoscope was  ?                       passed under direct vision. Throughout the procedure,  ?                       the patient's blood pressure, pulse, and oxygen  ?                       saturations were monitored continuously. The  ?                       Colonoscope was introduced through the anus and  ?  advanced to the the terminal ileum, with  ?                       identification of the appendiceal orifice and IC  ?                       valve. The colonoscopy was performed without  ?                       difficulty. The patient tolerated the procedure well.  ?                       The quality of the bowel preparation was evaluated  ?                       using the BBPS [facility] Bowel Preparation Scale) with  ?                       scores of: Right Colon = 3, Transverse Colon = 3 and  ?                       Left Colon = 3 (entire mucosa seen well with no  ?                       residual staining, small fragments  of stool or opaque  ?                       liquid). The total BBPS score equals 9. ?Findings: ?     The perianal and digital rectal examinations were normal. Pertinent  ?     negatives include normal sphincter tone and no palpable rectal lesions. ?     Normal mucosa was found in the entire colon. Biopsies were taken with a  ?     cold forceps for histology. ?     The terminal ileum appeared normal. ?     Two pedunculated polyps were found in the sigmoid colon. The polyps were  ?     8 to 12 mm in size. These polyps were removed with a hot snare.  ?     Resection and retrieval were complete. Estimated blood loss: none. ?     The retroflexed view of the distal rectum and anal verge was normal and  ?     showed no anal or rectal abnormalities. ?Impression:            - Normal mucosa in the entire examined colon. Biopsied. ?                       - The examined portion of the ileum was normal. ?                       - Two 8 to 12 mm polyps in the sigmoid colon, removed  ?                       with a hot snare. Resected and retrieved. ?                       - The distal rectum and anal verge are normal on  ?  retroflexion view. ?Recommendation:        - Discharge patient to home (with spouse). ?                       - Resume previous diet today. ?                       - Continue present medications. ?                       - Await pathology results. ?                       - Repeat colonoscopy in 3 years for surveillance of  ?                       multiple polyps. ?                       - Return to my office as previously scheduled. ?Procedure Code(s):     --- Professional --- ?                       [phone], Colonoscopy, flexible; with removal of  ?                       tumor(s), polyp(s), or other lesion(s) by snare  ?                       technique ?                       [id number], 59, Colonoscopy, flexible; with biopsy, single  ?                       or multiple ?Diagnosis Code(s):     ---  Professional --- ?                       K63.5, Polyp of colon ?                       R19.7, Diarrhea, unspecified ?                       R63.4, Abnormal weight loss ?CPT copyright 2019 American Medical Association. All rights reserved. ?The codes documented in this report are preliminary and upon coder review may  ?be revised to meet current compliance requirements. ?Dr. Ulyess Mort ?Tuck Dulworth Raeanne Gathers MD, MD ?08/20/2021 9:40:46 AM ?This report has been signed electronically. ?Number of Addenda: 0 ?Note Initiated On: 08/20/2021 9:02 AM ?Scope Withdrawal Time: 0 hours 12 minutes 59 seconds  ?Total Procedure Duration: 0 hours 16 minutes 19 seconds  ?Estimated Blood Loss:  Estimated blood loss: none. ?     [facility] ?

## 2021-08-20 NOTE — Op Note (Signed)
[facility] ?Gastroenterology ?Patient Name: Dawn Conrad ?Procedure Date: 08/20/2021 9:03 AM ?MRN: [id number] ?Account #: [id number] ?Date of Birth: 1975-07-19 ?Admit Type: Outpatient ?Age: 46 ?Room: [facility] OR ROOM 01 ?Gender: Female ?Note Status: Finalized ?Instrument Name: [id number] ?Procedure:             Upper GI endoscopy ?Indications:           Diarrhea, Nausea, Weight loss ?Providers:             Lin Landsman MD, MD ?Referring MD:          Jearld Fenton (Referring MD) ?Medicines:             General Anesthesia ?Complications:         No immediate complications. Estimated blood loss: None. ?Procedure:             Pre-Anesthesia Assessment: ?                       - Prior to the procedure, a History and Physical was  ?                       performed, and patient medications and allergies were  ?                       reviewed. The patient is competent. The risks and  ?                       benefits of the procedure and the sedation options and  ?                       risks were discussed with the patient. All questions  ?                       were answered and informed consent was obtained.  ?                       Patient identification and proposed procedure were  ?                       verified by the physician, the nurse, the  ?                       anesthesiologist, the anesthetist and the technician  ?                       in the pre-procedure area in the procedure room in the  ?                       endoscopy suite. Mental Status Examination: alert and  ?                       oriented. Airway Examination: normal oropharyngeal  ?                       airway and neck mobility. Respiratory Examination:  ?                       clear to auscultation. CV Examination: normal.  ?  Prophylactic Antibiotics: The patient does not require  ?                       prophylactic antibiotics. Prior Anticoagulants: The  ?                       patient has taken no  previous anticoagulant or  ?                       antiplatelet agents. ASA Grade Assessment: II - A  ?                       patient with mild systemic disease. After reviewing  ?                       the risks and benefits, the patient was deemed in  ?                       satisfactory condition to undergo the procedure. The  ?                       anesthesia plan was to use general anesthesia.  ?                       Immediately prior to administration of medications,  ?                       the patient was re-assessed for adequacy to receive  ?                       sedatives. The heart rate, respiratory rate, oxygen  ?                       saturations, blood pressure, adequacy of pulmonary  ?                       ventilation, and response to care were monitored  ?                       throughout the procedure. The physical status of the  ?                       patient was re-assessed after the procedure. ?                       After obtaining informed consent, the endoscope was  ?                       passed under direct vision. Throughout the procedure,  ?                       the patient's blood pressure, pulse, and oxygen  ?                       saturations were monitored continuously. The Endoscope  ?                       was introduced through the mouth, and advanced to the  ?  second part of duodenum. The upper GI endoscopy was  ?                       accomplished without difficulty. The patient tolerated  ?                       the procedure well. ?Findings: ?     The duodenal bulb and second portion of the duodenum were normal.  ?     Biopsies were taken with a cold forceps for histology. ?     The entire examined stomach was normal. Biopsies were taken with a cold  ?     forceps for histology. ?     The cardia and gastric fundus were normal on retroflexion. ?     Esophagogastric landmarks were identified: the gastroesophageal junction  ?     was found at 39 cm from  the incisors. ?     LA Grade A (one or more mucosal breaks less than 5 mm, not extending  ?     between tops of 2 mucosal folds) esophagitis with no bleeding was found  ?     at the gastroesophageal junction. ?Impression:            - Normal duodenal bulb and second portion of the  ?                       duodenum. Biopsied. ?                       - Normal stomach. Biopsied. ?                       - Esophagogastric landmarks identified. ?                       - LA Grade A reflux esophagitis with no bleeding. ?Recommendation:        - Await pathology results. ?                       - Follow an antireflux regimen. ?                       - Use Prilosec (omeprazole) 40 mg PO daily for 3  ?                       months. ?                       - Return to my office as previously scheduled. ?                       - Proceed with colonoscopy as scheduled ?                       See colonoscopy report ?Procedure Code(s):     --- Professional --- ?                       [phone], Esophagogastroduodenoscopy, flexible,  ?                       transoral; with biopsy, single or multiple ?Diagnosis Code(s):     ---  Professional --- ?                       K21.00, Gastro-esophageal reflux disease with  ?                       esophagitis, without bleeding ?                       R19.7, Diarrhea, unspecified ?                       R11.0, Nausea ?                       R63.4, Abnormal weight loss ?CPT copyright 2019 American Medical Association. All rights reserved. ?The codes documented in this report are preliminary and upon coder review may  ?be revised to meet current compliance requirements. ?Dr. Ulyess Mort ?Dellene Mcgroarty Raeanne Gathers MD, MD ?08/20/2021 9:19:33 AM ?This report has been signed electronically. ?Number of Addenda: 0 ?Note Initiated On: 08/20/2021 9:03 AM ?Total Procedure Duration: 0 hours 6 minutes 29 seconds  ?Estimated Blood Loss:  Estimated blood loss: none. ?     [facility] ?

## 2021-08-20 NOTE — Transfer of Care (Signed)
Immediate Anesthesia Transfer of Care Note ? ?Patient: Dawn Conrad ? ?Procedure(s) Performed: COLONOSCOPY WITH PROPOFOL ?ESOPHAGOGASTRODUODENOSCOPY (EGD) WITH PROPOFOL ?POLYPECTOMY (Rectum) ? ?Patient Location: PACU ? ?Anesthesia Type: General ? ?Level of Consciousness: awake, alert  and patient cooperative ? ?Airway and Oxygen Therapy: Patient Spontanous Breathing and Patient connected to supplemental oxygen ? ?Post-op Assessment: Post-op Vital signs reviewed, Patient's Cardiovascular Status Stable, Respiratory Function Stable, Patent Airway and No signs of Nausea or vomiting ? ?Post-op Vital Signs: Reviewed and stable ? ?Complications: No notable events documented. ? ?

## 2021-08-20 NOTE — H&P (Signed)
?Cephas Darby, MD ?[address]  ?[address]  ?[place], [place] [address]  ?Main: [phone]  ?Fax: [phone] ?Pager: [phone] ? ?Primary Care Physician:  Jearld Fenton, NP ?Primary Gastroenterologist:  Dr. Cephas Darby ? ?Pre-Procedure History & Physical: ?HPI:  Dawn Conrad is a 46 y.o. female is here for an endoscopy and colonoscopy. ?  ?Past Medical History:  ?Diagnosis Date  ? Allergy   ? Anxiety   ? Frequent headaches   ? History of chicken pox   ? ? ?Past Surgical History:  ?Procedure Laterality Date  ? KNEE SURGERY Right   ? bone spur removal  ? ? ?Prior to Admission medications   ?Medication Sig Start Date End Date Taking? Authorizing Provider  ?ALPRAZolam (XANAX) 0.5 MG tablet Take 1 tablet (0.5 mg total) by mouth at bedtime as needed. TAKE 1/2 TO 1 TABLET ONCE A DAY 02/07/21  Yes Jearld Fenton, NP  ?ondansetron (ZOFRAN) 4 MG tablet Take 1 tablet (4 mg total) by mouth daily as needed for nausea or vomiting. 07/22/21  Yes Baity, Coralie Keens, NP  ?traMADol (ULTRAM) 50 MG tablet TAKE ONE TABLET EVERY DAY AS NEEDED 07/22/21  Yes Jearld Fenton, NP  ?levonorgestrel (MIRENA) 20 MCG/24HR IUD 1 each by Intrauterine route once.    [provider]  ? ? ?Allergies as of 08/01/2021 - Review Complete 08/01/2021  ?Allergen Reaction Noted  ? Other Other (See Comments) 11/01/2019  ? Codeine Other (See Comments) 07/23/2015  ? ? ?Family History  ?Problem Relation Age of Onset  ? Arthritis Mother   ? Arthritis Father   ? Heart disease Father   ? Arthritis Maternal Grandmother   ? Arthritis Paternal Grandmother   ? ? ?Social History  ? ?Socioeconomic History  ? Marital status: Married  ?  Spouse name: Not on file  ? Number of children: Not on file  ? Years of education: Not on file  ? Highest education level: Not on file  ?Occupational History  ? Not on file  ?Tobacco Use  ? Smoking status: Never  ? Smokeless tobacco: Never  ?Vaping Use  ? Vaping Use: Never used  ?Substance and Sexual Activity  ?  Alcohol use: Yes  ?  Comment: occasional  ? Drug use: No  ? Sexual activity: Yes  ?  Comment: mirena  ?Other Topics Concern  ? Not on file  ?Social History Narrative  ? Not on file  ? ?Social Determinants of Health  ? ?Financial Resource Strain: Not on file  ?Food Insecurity: Not on file  ?Transportation Needs: Not on file  ?Physical Activity: Not on file  ?Stress: Not on file  ?Social Connections: Not on file  ?Intimate Partner Violence: Not on file  ? ? ?Review of Systems: ?See HPI, otherwise negative ROS ? ?Physical Exam: ?BP (!) 111/59   Pulse 69   Temp 97.7 ?F (36.5 ?C) (Temporal)   Resp 20   Wt 54 kg   SpO2 100%   BMI 21.77 kg/m?  ?General:   Alert,  pleasant and cooperative in NAD ?Head:  Normocephalic and atraumatic. ?Neck:  Supple; no masses or thyromegaly. ?Lungs:  Clear throughout to auscultation.    ?Heart:  Regular rate and rhythm. ?Abdomen:  Soft, nontender and nondistended. Normal bowel sounds, without guarding, and without rebound.   ?Neurologic:  Alert and  oriented x4;  grossly normal neurologically. ? ?Impression/Plan: ?RAGEN LAVER is here for an endoscopy and colonoscopy to be performed for 2 months  history of nausea, early satiety, unintentional weight loss, loose stools ? ?Risks, benefits, limitations, and alternatives regarding  endoscopy and colonoscopy have been reviewed with the patient.  Questions have been answered.  All parties agreeable. ? ? ?Sherri Sear, MD  08/20/2021, 8:53 AM ?

## 2021-08-20 NOTE — Anesthesia Preprocedure Evaluation (Signed)
Anesthesia Evaluation  ?Patient identified by MRN, date of birth, ID band ?Patient awake ? ? ? ?Reviewed: ?Allergy & Precautions, H&P , NPO status , Patient's Chart, lab work & pertinent test results ? ?Airway ?Mallampati: I ? ?TM Distance: >3 FB ?Neck ROM: full ? ? ? Dental ?no notable dental hx. ? ?  ?Pulmonary ? ?  ?Pulmonary exam normal ?breath sounds clear to auscultation ? ? ? ? ? ? Cardiovascular ?Normal cardiovascular exam ?Rhythm:regular Rate:Normal ? ? ?  ?Neuro/Psych ? Headaches, Anxiety   ? GI/Hepatic ?  ?Endo/Other  ? ? Renal/GU ?  ? ?  ?Musculoskeletal ? ? Abdominal ?  ?Peds ? Hematology ?  ?Anesthesia Other Findings ? ? Reproductive/Obstetrics ? ?  ? ? ? ? ? ? ? ? ? ? ? ? ? ?  ?  ? ? ? ? ? ? ? ? ?Anesthesia Physical ?Anesthesia Plan ? ?ASA: 2 ? ?Anesthesia Plan: General  ? ?Post-op Pain Management: Minimal or no pain anticipated  ? ?Induction: Intravenous ? ?PONV Risk Score and Plan: 3 and Treatment may vary due to age or medical condition, TIVA and Propofol infusion ? ?Airway Management Planned: Natural Airway ? ?Additional Equipment:  ? ?Intra-op Plan:  ? ?Post-operative Plan:  ? ?Informed Consent: I have reviewed the patients History and Physical, chart, labs and discussed the procedure including the risks, benefits and alternatives for the proposed anesthesia with the patient or authorized representative who has indicated his/her understanding and acceptance.  ? ? ? ?Dental Advisory Given ? ?Plan Discussed with: CRNA ? ?Anesthesia Plan Comments:   ? ? ? ? ? ? ?Anesthesia Quick Evaluation ? ?

## 2021-08-20 NOTE — Anesthesia Postprocedure Evaluation (Signed)
Anesthesia Post Note ? ?Patient: Dawn Conrad ? ?Procedure(s) Performed: COLONOSCOPY WITH PROPOFOL ?ESOPHAGOGASTRODUODENOSCOPY (EGD) WITH PROPOFOL ?POLYPECTOMY (Rectum) ? ? ?  ?Patient location during evaluation: PACU ?Anesthesia Type: General ?Level of consciousness: awake and alert and oriented ?Pain management: satisfactory to patient ?Vital Signs Assessment: post-procedure vital signs reviewed and stable ?Respiratory status: spontaneous breathing, nonlabored ventilation and respiratory function stable ?Cardiovascular status: blood pressure returned to baseline and stable ?Postop Assessment: Adequate PO intake and No signs of nausea or vomiting ?Anesthetic complications: no ? ? ?No notable events documented. ? ?Raliegh Ip ? ? ? ? ? ?

## 2021-08-21 ENCOUNTER — Encounter: Payer: Self-pay | Admitting: Gastroenterology

## 2021-08-21 LAB — SURGICAL PATHOLOGY

## 2021-08-22 ENCOUNTER — Telehealth: Payer: Self-pay

## 2021-08-22 ENCOUNTER — Encounter: Payer: Self-pay | Admitting: Gastroenterology

## 2021-08-22 NOTE — Telephone Encounter (Signed)
Patient verbalized understanding. She states she does not want to do a gastric emptying study. She states she saw another doctor yesterday and she states he has her on the right path she thinks  ?

## 2021-08-22 NOTE — Telephone Encounter (Signed)
-----   Message from Lin Landsman, MD sent at 08/22/2021 10:15 AM EDT ----- ?Pathology results were normal. Colon polyps were benign, repeat colonoscopy in 3 years ? ?Recommend gastric emptying study for nausea, weight loss ? ?RV ?

## 2021-10-01 ENCOUNTER — Telehealth: Payer: No Typology Code available for payment source | Admitting: Gastroenterology

## 2022-04-13 IMAGING — CT CT ABD-PELV W/ CM
2 of 5 series · 16 of 46 positions shown, 18 images · IV contrast (APPLIED)
Comparison: CT chest dated December 26, 2019

CLINICAL DATA: Weight loss, unintended.

EXAM:
CT ABDOMEN AND PELVIS WITH CONTRAST
TECHNIQUE: Multidetector CT imaging of the abdomen and pelvis was performed
using the standard protocol following bolus administration of
intravenous contrast.

[Series 2: axial st · axial · 0.72mm/px · z∈[-466,-66]mm · 13 of 90 slices shown, 15 images]
[im 5/90  soft-tissue]
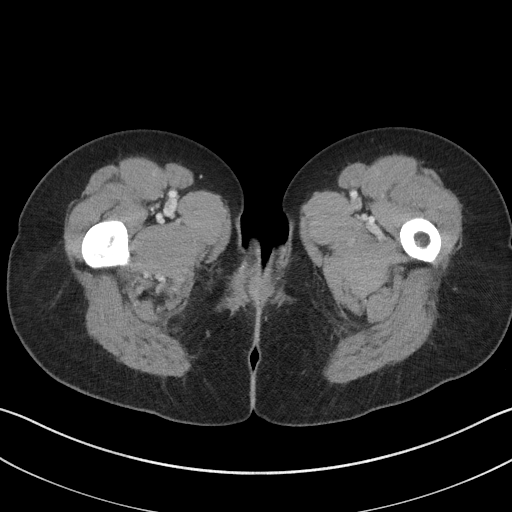
[im 5/90  bone]
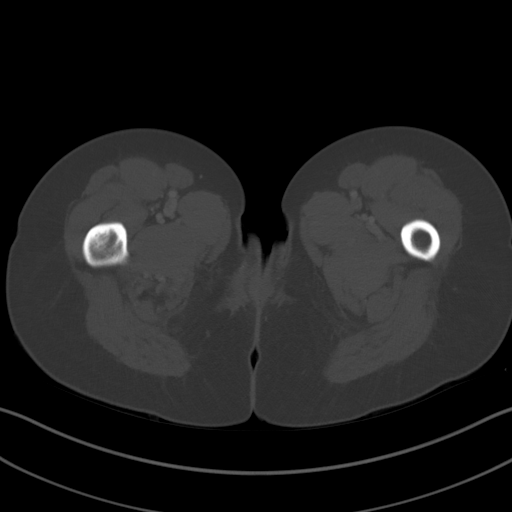
[im 15/90  soft-tissue]
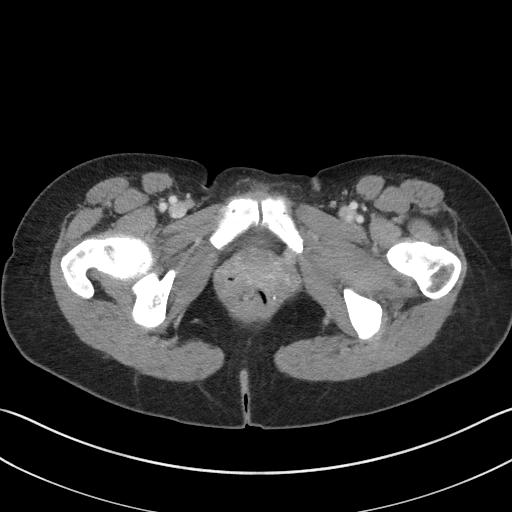
[im 19/90  soft-tissue]
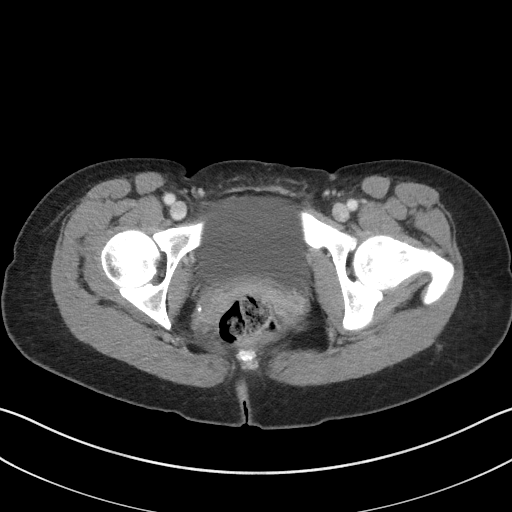
[im 24/90  soft-tissue]
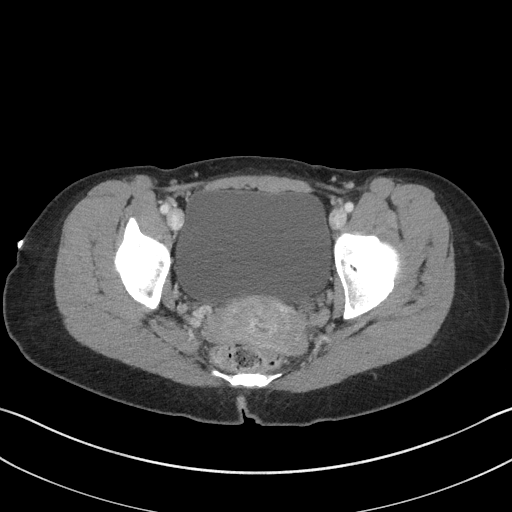
[im 33/90  soft-tissue]
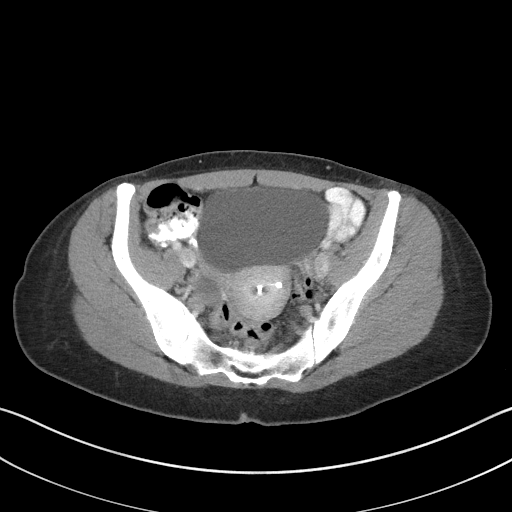
[im 38/90  soft-tissue]
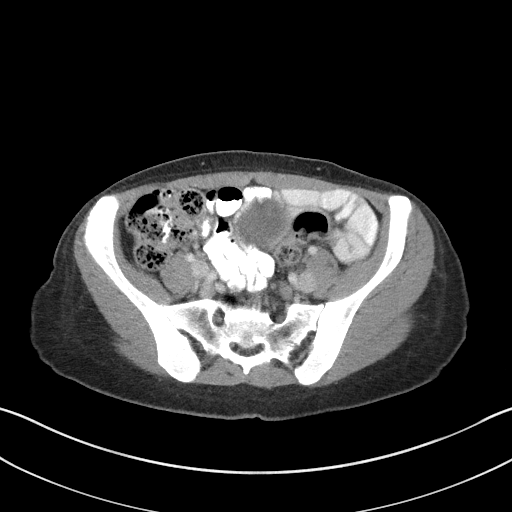
[im 47/90  soft-tissue]
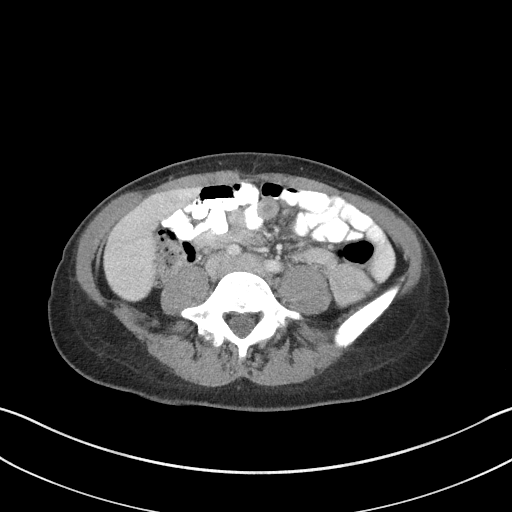
[im 52/90  soft-tissue]
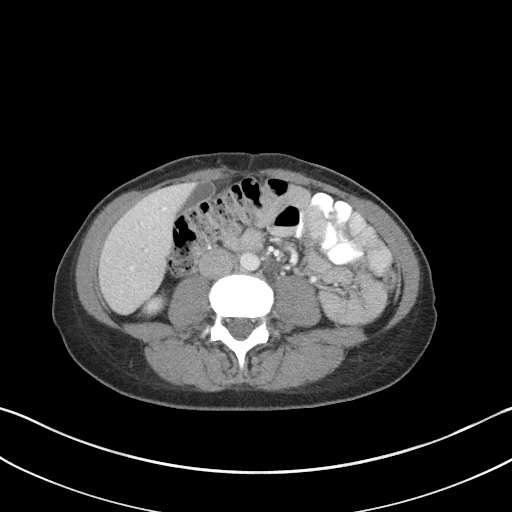
[im 57/90  soft-tissue]
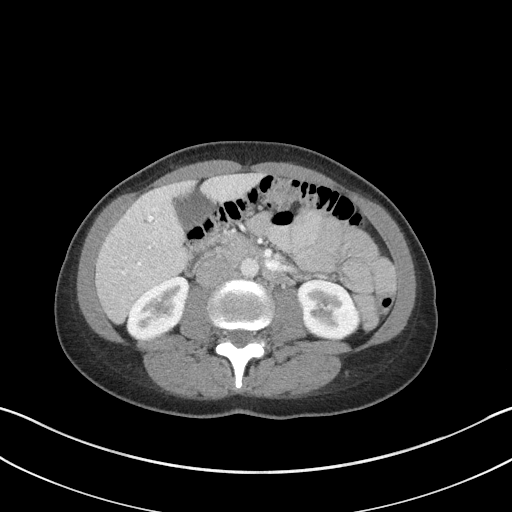
[im 57/90  bone]
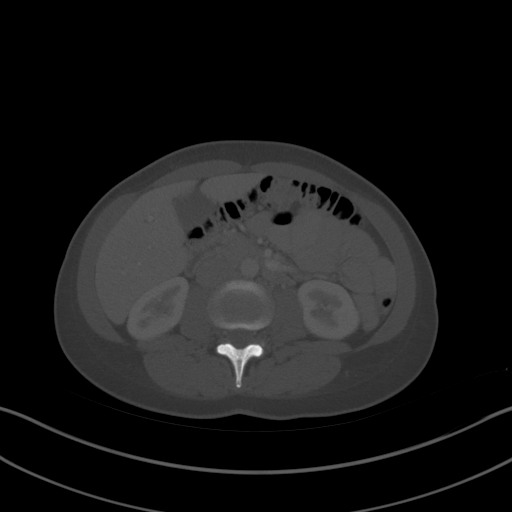
[im 66/90  soft-tissue]
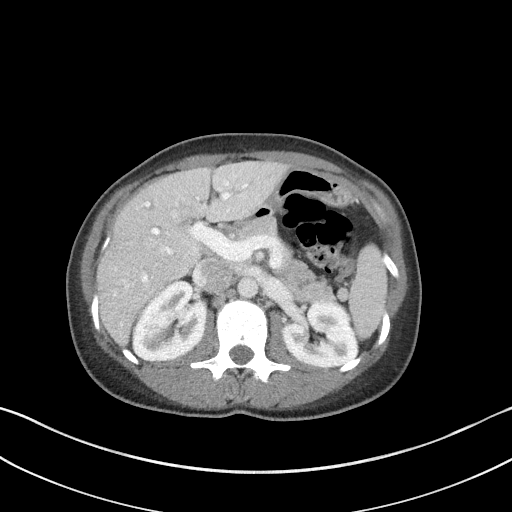
[im 71/90  soft-tissue]
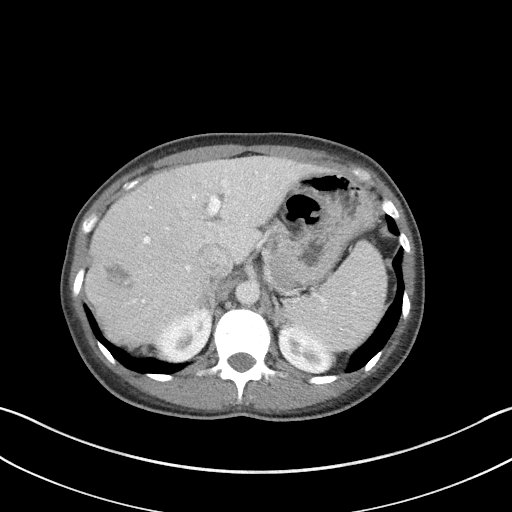
[im 75/90  soft-tissue]
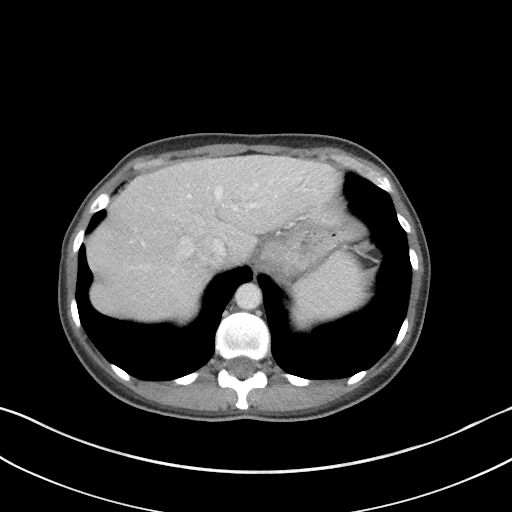
[im 85/90  soft-tissue]
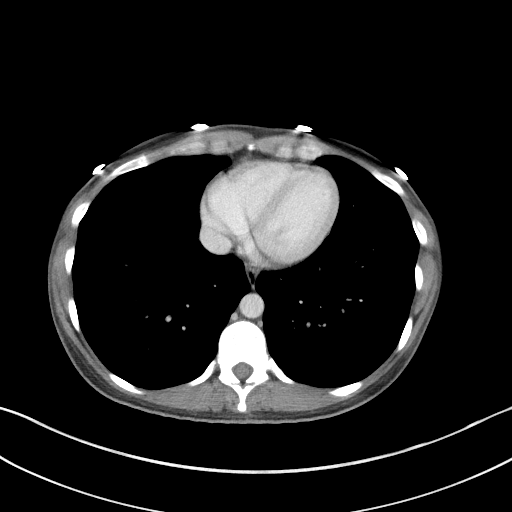

[Series 5: coronal st · coronal · 0.69mm/px · 3 of 77 slices shown]
[im 26/77  soft-tissue]
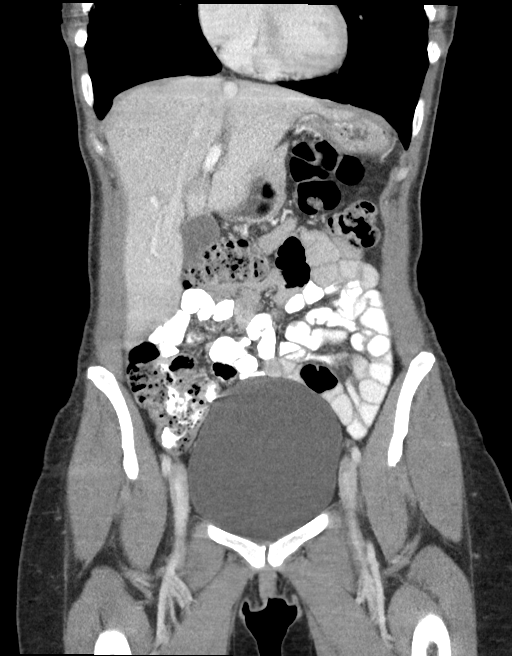
[im 34/77  soft-tissue]
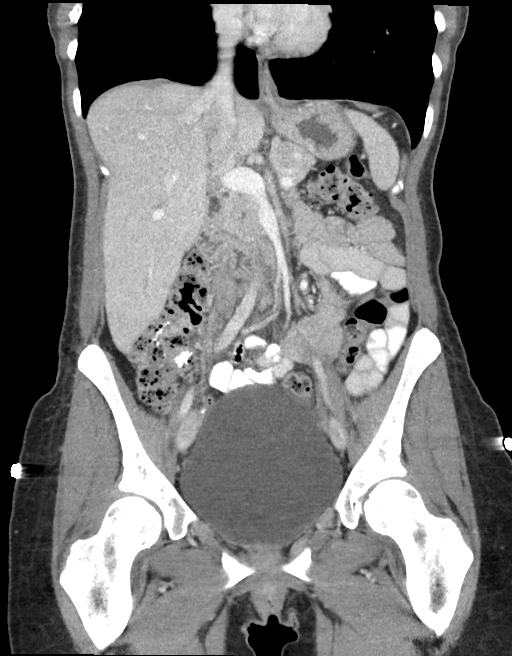
[im 43/77  soft-tissue]
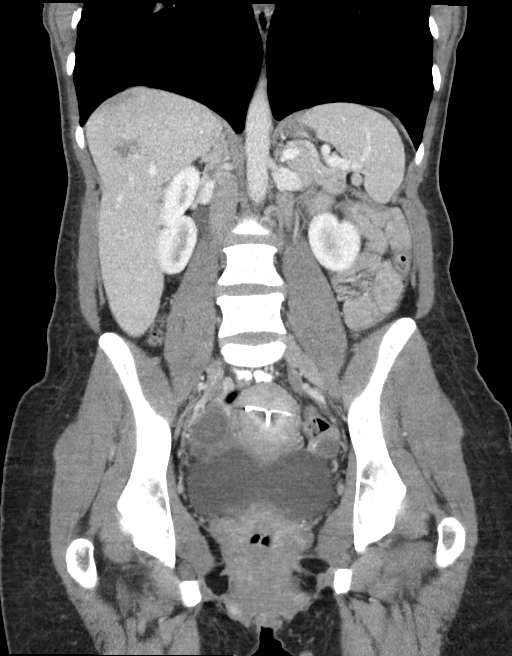

[16 of 46 positions shown; findings below may reference images not displayed]

RADIATION DOSE REDUCTION: This exam was performed according to the
departmental dose-optimization program which includes automated
exposure control, adjustment of the mA and/or kV according to
patient size and/or use of iterative reconstruction technique.

CONTRAST:  80mL OMNIPAQUE IOHEXOL 300 MG/ML  SOLN
FINDINGS: Lower chest: No acute abnormality.

Hepatobiliary: Hypodense structure in the posterior aspect of the
right hepatic lobe, likely hemangioma. No other focal hepatic
abnormality. No gallstones, gallbladder wall thickening, or biliary
dilatation.

Pancreas: Unremarkable. No pancreatic ductal dilatation or
surrounding inflammatory changes.

Spleen: Normal in size without focal abnormality.

Adrenals/Urinary Tract: Adrenal glands are unremarkable. Kidneys are
normal, without renal calculi, focal lesion, or hydronephrosis.
Bladder is unremarkable.

Stomach/Bowel: Stomach is within normal limits. Appendix not clearly
visualized,. No evidence of bowel wall thickening, distention, or
inflammatory changes.

Vascular/Lymphatic: No significant vascular findings are present. No
enlarged abdominal or pelvic lymph nodes.

Reproductive: Intrauterine contraceptive device in satisfactory
position. Right renal cysts.

Other: No abdominal wall hernia or abnormality. No abdominopelvic
ascites.

Musculoskeletal: No acute or significant osseous findings.
IMPRESSION: 1. Hypodense structure in the posterior aspect of the right hepatic
lobe, likely hemangioma. No other focal hepatic abnormality.

2.  No appreciable abdominal/pelvic mass.

3. Right adnexal cyst, further evaluation with pelvic sonogram would
be helpful.

4.  Intrauterine contraceptive device in satisfactory position.

## 2023-02-17 ENCOUNTER — Telehealth: Payer: Self-pay

## 2023-02-17 NOTE — Transitions of Care (Post Inpatient/ED Visit) (Signed)
   02/17/2023  Name: Dawn Conrad MRN: [id number] DOB: 1975/09/27  Today's TOC FU Call Status: Today's TOC FU Call Status:: Successful TOC FU Call Completed TOC FU Call Complete Date: 02/17/23 Patient's Name and Date of Birth confirmed.  Transition Care Management Follow-up Telephone Call Date of Discharge: 02/16/23 Discharge Facility: Other [facility]) Name of Other (Non[facility]) Discharge Facility: [facility] Type of Discharge: Inpatient Admission Primary Inpatient Discharge Diagnosis:: bradycardia How have you been since you were released from the hospital?: Better Any questions or concerns?: No  Items Reviewed: Did you receive and understand the discharge instructions provided?: Yes Medications obtained,verified, and reconciled?: Yes (Medications Reviewed) Any new allergies since your discharge?: No Dietary orders reviewed?: Yes Do you have support at home?: Yes People in Home: spouse  Medications Reviewed Today: Medications Reviewed Today     Reviewed by Karena Addison, LPN (Licensed Practical Nurse) on 02/17/23 at 1457  Med List Status: <None>   Medication Order Taking? Sig Documenting Provider Last Dose Status Informant  ALPRAZolam (XANAX) 0.5 MG tablet [id number] No Take 1 tablet (0.5 mg total) by mouth at bedtime as needed. TAKE 1/2 TO 1 TABLET ONCE A DAY Baity, Salvadore Oxford, NP Taking Active   levonorgestrel (MIRENA) 20 MCG/24HR IUD [id number] No 1 each by Intrauterine route once. [provider] Taking Active   omeprazole (PRILOSEC) 40 MG capsule [id number]  Take 1 capsule (40 mg total) by mouth daily before breakfast. Toney Reil, MD  Expired 09/19/21 2359   ondansetron (ZOFRAN) 4 MG tablet [id number] No Take 1 tablet (4 mg total) by mouth daily as needed for nausea or vomiting. Lorre Munroe, NP Taking Active   traMADol Janean Sark) 50 MG tablet [id number] No TAKE ONE TABLET EVERY DAY AS NEEDED Lorre Munroe, NP Taking Active   Med List Note  Roena Malady, [place] 11/29/19 5366): UDS/CSA  Completed 10/31/19 repeat 01/31/2020            Home Care and Equipment/Supplies: Were Home Health Services Ordered?: NA Any new equipment or medical supplies ordered?: NA  Functional Questionnaire: Do you need assistance with bathing/showering or dressing?: No Do you need assistance with meal preparation?: No Do you need assistance with eating?: No Do you have difficulty maintaining continence: No Do you need assistance with getting out of bed/getting out of a chair/moving?: No Do you have difficulty managing or taking your medications?: No  Follow up appointments reviewed: PCP Follow-up appointment confirmed?: NA Specialist Hospital Follow-up appointment confirmed?: Yes Date of Specialist follow-up appointment?: 02/18/23 Follow-Up Specialty Provider:: cardio Do you need transportation to your follow-up appointment?: No Do you understand care options if your condition(s) worsen?: Yes-patient verbalized understanding    SIGNATURE Karena Addison, LPN [facility] Nurse Health Advisor Direct Dial [phone]
